# Patient Record
Sex: Female | Born: 1960 | Race: White | Hispanic: No | Marital: Married | Smoking: Former smoker
Health system: Southern US, Community
[De-identification: ages and names within clinical notes are randomized; demographics above are authoritative.]

## PROBLEM LIST (undated history)

## (undated) DIAGNOSIS — I499 Cardiac arrhythmia, unspecified: Secondary | ICD-10-CM

## (undated) DIAGNOSIS — S6990XA Unspecified injury of unspecified wrist, hand and finger(s), initial encounter: Secondary | ICD-10-CM

## (undated) DIAGNOSIS — D126 Benign neoplasm of colon, unspecified: Secondary | ICD-10-CM

## (undated) DIAGNOSIS — E785 Hyperlipidemia, unspecified: Secondary | ICD-10-CM

## (undated) DIAGNOSIS — F419 Anxiety disorder, unspecified: Secondary | ICD-10-CM

## (undated) DIAGNOSIS — C439 Malignant melanoma of skin, unspecified: Secondary | ICD-10-CM

## (undated) DIAGNOSIS — T7840XA Allergy, unspecified, initial encounter: Secondary | ICD-10-CM

## (undated) DIAGNOSIS — K219 Gastro-esophageal reflux disease without esophagitis: Secondary | ICD-10-CM

## (undated) HISTORY — PX: COLONOSCOPY: SHX174

## (undated) HISTORY — PX: OTHER SURGICAL HISTORY: SHX169

## (undated) HISTORY — DX: Allergy, unspecified, initial encounter: T78.40XA

## (undated) HISTORY — DX: Benign neoplasm of colon, unspecified: D12.6

## (undated) HISTORY — DX: Hyperlipidemia, unspecified: E78.5

## (undated) HISTORY — PX: MELANOMA EXCISION: SHX5266

## (undated) HISTORY — DX: Anxiety disorder, unspecified: F41.9

## (undated) HISTORY — PX: COLONOSCOPY W/ POLYPECTOMY: SHX1380

## (undated) HISTORY — PX: VARICOSE VEIN SURGERY: SHX832

## (undated) HISTORY — DX: Malignant melanoma of skin, unspecified: C43.9

---

## 1981-09-26 DIAGNOSIS — C439 Malignant melanoma of skin, unspecified: Secondary | ICD-10-CM

## 1981-09-26 HISTORY — DX: Malignant melanoma of skin, unspecified: C43.9

## 1998-04-25 ENCOUNTER — Ambulatory Visit (HOSPITAL_COMMUNITY): Admission: RE | Admit: 1998-04-25 | Discharge: 1998-04-25 | Payer: Self-pay | Admitting: Gynecology

## 1998-04-29 ENCOUNTER — Ambulatory Visit (HOSPITAL_COMMUNITY): Admission: RE | Admit: 1998-04-29 | Discharge: 1998-04-29 | Payer: Self-pay | Admitting: Gynecology

## 1998-06-27 ENCOUNTER — Ambulatory Visit (HOSPITAL_COMMUNITY): Admission: RE | Admit: 1998-06-27 | Discharge: 1998-06-27 | Payer: Self-pay | Admitting: *Deleted

## 1998-10-31 ENCOUNTER — Encounter: Payer: Self-pay | Admitting: Gynecology

## 1998-10-31 ENCOUNTER — Ambulatory Visit (HOSPITAL_COMMUNITY): Admission: RE | Admit: 1998-10-31 | Discharge: 1998-10-31 | Payer: Self-pay | Admitting: Gynecology

## 1999-03-05 ENCOUNTER — Other Ambulatory Visit: Admission: RE | Admit: 1999-03-05 | Discharge: 1999-03-05 | Payer: Self-pay | Admitting: Gynecology

## 1999-05-11 ENCOUNTER — Ambulatory Visit (HOSPITAL_COMMUNITY): Admission: RE | Admit: 1999-05-11 | Discharge: 1999-05-11 | Payer: Self-pay | Admitting: Gynecology

## 1999-05-11 ENCOUNTER — Encounter: Payer: Self-pay | Admitting: Gynecology

## 1999-12-02 ENCOUNTER — Encounter: Admission: RE | Admit: 1999-12-02 | Discharge: 1999-12-02 | Payer: Self-pay | Admitting: Geriatric Medicine

## 1999-12-02 ENCOUNTER — Encounter: Payer: Self-pay | Admitting: Geriatric Medicine

## 2000-03-18 ENCOUNTER — Other Ambulatory Visit: Admission: RE | Admit: 2000-03-18 | Discharge: 2000-03-18 | Payer: Self-pay | Admitting: Gynecology

## 2000-03-18 ENCOUNTER — Encounter (INDEPENDENT_AMBULATORY_CARE_PROVIDER_SITE_OTHER): Payer: Self-pay

## 2001-01-30 ENCOUNTER — Encounter: Admission: RE | Admit: 2001-01-30 | Discharge: 2001-01-30 | Payer: Self-pay | Admitting: Geriatric Medicine

## 2001-01-30 ENCOUNTER — Encounter: Payer: Self-pay | Admitting: Geriatric Medicine

## 2001-05-23 ENCOUNTER — Other Ambulatory Visit: Admission: RE | Admit: 2001-05-23 | Discharge: 2001-05-23 | Payer: Self-pay | Admitting: Gynecology

## 2001-12-08 ENCOUNTER — Encounter: Admission: RE | Admit: 2001-12-08 | Discharge: 2001-12-08 | Payer: Self-pay | Admitting: Otolaryngology

## 2001-12-08 ENCOUNTER — Encounter: Payer: Self-pay | Admitting: Otolaryngology

## 2002-02-07 ENCOUNTER — Encounter: Payer: Self-pay | Admitting: Gynecology

## 2002-02-07 ENCOUNTER — Ambulatory Visit (HOSPITAL_COMMUNITY): Admission: RE | Admit: 2002-02-07 | Discharge: 2002-02-07 | Payer: Self-pay | Admitting: Gynecology

## 2002-04-13 ENCOUNTER — Encounter: Admission: RE | Admit: 2002-04-13 | Discharge: 2002-04-13 | Payer: Self-pay | Admitting: Geriatric Medicine

## 2002-04-13 ENCOUNTER — Encounter: Payer: Self-pay | Admitting: Geriatric Medicine

## 2002-06-13 ENCOUNTER — Encounter: Payer: Self-pay | Admitting: Gynecology

## 2002-06-13 ENCOUNTER — Encounter: Admission: RE | Admit: 2002-06-13 | Discharge: 2002-06-13 | Payer: Self-pay | Admitting: Gynecology

## 2002-07-20 ENCOUNTER — Other Ambulatory Visit: Admission: RE | Admit: 2002-07-20 | Discharge: 2002-07-20 | Payer: Self-pay | Admitting: Gynecology

## 2002-09-24 ENCOUNTER — Encounter: Admission: RE | Admit: 2002-09-24 | Discharge: 2002-09-24 | Payer: Self-pay | Admitting: Geriatric Medicine

## 2002-09-24 ENCOUNTER — Encounter: Payer: Self-pay | Admitting: Geriatric Medicine

## 2003-02-11 ENCOUNTER — Encounter: Admission: RE | Admit: 2003-02-11 | Discharge: 2003-02-11 | Payer: Self-pay | Admitting: Gynecology

## 2003-02-11 ENCOUNTER — Encounter: Payer: Self-pay | Admitting: Gynecology

## 2003-02-11 ENCOUNTER — Ambulatory Visit (HOSPITAL_BASED_OUTPATIENT_CLINIC_OR_DEPARTMENT_OTHER): Admission: RE | Admit: 2003-02-11 | Discharge: 2003-02-11 | Payer: Self-pay | Admitting: Plastic Surgery

## 2003-02-11 ENCOUNTER — Encounter (INDEPENDENT_AMBULATORY_CARE_PROVIDER_SITE_OTHER): Payer: Self-pay | Admitting: Specialist

## 2003-08-15 ENCOUNTER — Other Ambulatory Visit: Admission: RE | Admit: 2003-08-15 | Discharge: 2003-08-15 | Payer: Self-pay | Admitting: Obstetrics and Gynecology

## 2004-02-13 ENCOUNTER — Encounter: Admission: RE | Admit: 2004-02-13 | Discharge: 2004-02-13 | Payer: Self-pay | Admitting: General Surgery

## 2004-08-18 ENCOUNTER — Other Ambulatory Visit: Admission: RE | Admit: 2004-08-18 | Discharge: 2004-08-18 | Payer: Self-pay | Admitting: Obstetrics and Gynecology

## 2005-01-15 ENCOUNTER — Encounter (INDEPENDENT_AMBULATORY_CARE_PROVIDER_SITE_OTHER): Payer: Self-pay | Admitting: Specialist

## 2005-01-15 ENCOUNTER — Ambulatory Visit (HOSPITAL_COMMUNITY): Admission: RE | Admit: 2005-01-15 | Discharge: 2005-01-15 | Payer: Self-pay | Admitting: Vascular Surgery

## 2005-02-15 ENCOUNTER — Encounter: Admission: RE | Admit: 2005-02-15 | Discharge: 2005-02-15 | Payer: Self-pay | Admitting: Obstetrics and Gynecology

## 2005-08-25 ENCOUNTER — Other Ambulatory Visit: Admission: RE | Admit: 2005-08-25 | Discharge: 2005-08-25 | Payer: Self-pay | Admitting: Obstetrics and Gynecology

## 2006-02-24 ENCOUNTER — Encounter: Admission: RE | Admit: 2006-02-24 | Discharge: 2006-02-24 | Payer: Self-pay | Admitting: Obstetrics and Gynecology

## 2006-08-31 ENCOUNTER — Other Ambulatory Visit: Admission: RE | Admit: 2006-08-31 | Discharge: 2006-08-31 | Payer: Self-pay | Admitting: Obstetrics and Gynecology

## 2007-02-27 ENCOUNTER — Encounter: Admission: RE | Admit: 2007-02-27 | Discharge: 2007-02-27 | Payer: Self-pay | Admitting: Obstetrics and Gynecology

## 2007-03-30 ENCOUNTER — Ambulatory Visit (HOSPITAL_COMMUNITY): Admission: RE | Admit: 2007-03-30 | Discharge: 2007-03-30 | Payer: Self-pay | Admitting: Gastroenterology

## 2007-03-30 ENCOUNTER — Encounter (INDEPENDENT_AMBULATORY_CARE_PROVIDER_SITE_OTHER): Payer: Self-pay | Admitting: Gastroenterology

## 2007-09-04 ENCOUNTER — Other Ambulatory Visit: Admission: RE | Admit: 2007-09-04 | Discharge: 2007-09-04 | Payer: Self-pay | Admitting: Obstetrics and Gynecology

## 2008-02-12 ENCOUNTER — Other Ambulatory Visit: Payer: Self-pay | Admitting: Otolaryngology

## 2008-02-12 ENCOUNTER — Encounter: Admission: RE | Admit: 2008-02-12 | Discharge: 2008-02-12 | Payer: Self-pay | Admitting: Otolaryngology

## 2008-02-28 ENCOUNTER — Encounter: Admission: RE | Admit: 2008-02-28 | Discharge: 2008-02-28 | Payer: Self-pay | Admitting: Obstetrics and Gynecology

## 2008-02-29 ENCOUNTER — Encounter: Admission: RE | Admit: 2008-02-29 | Discharge: 2008-02-29 | Payer: Self-pay | Admitting: Obstetrics and Gynecology

## 2009-03-11 ENCOUNTER — Encounter: Admission: RE | Admit: 2009-03-11 | Discharge: 2009-03-11 | Payer: Self-pay | Admitting: Obstetrics and Gynecology

## 2009-05-20 ENCOUNTER — Encounter: Payer: Self-pay | Admitting: Obstetrics and Gynecology

## 2009-05-20 ENCOUNTER — Other Ambulatory Visit: Admission: RE | Admit: 2009-05-20 | Discharge: 2009-05-20 | Payer: Self-pay | Admitting: Obstetrics and Gynecology

## 2009-05-20 ENCOUNTER — Ambulatory Visit: Payer: Self-pay | Admitting: Obstetrics and Gynecology

## 2010-03-12 ENCOUNTER — Encounter: Admission: RE | Admit: 2010-03-12 | Discharge: 2010-03-12 | Payer: Self-pay | Admitting: Obstetrics and Gynecology

## 2011-03-02 NOTE — Op Note (Signed)
Stephanie Benson, MACHO             ACCOUNT NO.:  [id number]   MEDICAL RECORD NO.:  [id number]          PATIENT TYPE:  AMB   LOCATION:  ENDO                         FACILITY:  [facility]   PHYSICIAN:  Anselmo Rod, M.D.  DATE OF BIRTH:  09-Jun-1961   DATE OF PROCEDURE:  03/30/2007  DATE OF DISCHARGE:                               OPERATIVE REPORT   PROCEDURE PERFORMED:  Colonoscopy with snare polypectomy x2 and cold  biopsies x11.   ENDOSCOPIST:  Anselmo Rod, M.D.   INSTRUMENT USED:  Pentax video colonoscope.   INDICATIONS FOR PROCEDURE:  A 50 year old white female with a personal  history of melanoma removed in the past undergoing colonoscopy for  rectal bleeding rule out colonic polyps, masses, etcetera.   PREPROCEDURE PREPARATION:  Informed consent was procured from the  patient.  The patient was fasted for 8 hours prior to the procedure and  prepped with 32 OsmoPrep pills the night prior to the procedure.  The  risks and benefits of the procedure including a 10% missed rate of  cancer and polyp were discussed with the patient as well.   PREPROCEDURE PHYSICAL:  VITAL SIGNS:  The patient with stable vital  signs.  NECK:  Supple.  CHEST:  Clear to auscultation.  HEART:  S1-S2 regular.  ABDOMEN:  Soft with normal bowel sounds.   DESCRIPTION OF THE PROCEDURE:  The patient was placed in the left  lateral decubitus position and sedated with 690 mg of Propofol given  intravenously in slow incremental doses.  Once the patient was  adequately sedated and maintained on low-flow oxygen and continuous  cardiac monitoring, the Pentax video colonoscope was advanced from the  rectum to the cecum. Small internal hemorrhoids were seen on  retroflexion.  Patchy erosions were biopsied from 10-30 cm (cold  biopsies x8).  A small sessile polyp was snared from 50 cm (hot snared  x1).  Another small flat polyp was snared from the proximal right colon  (hot snared x1).  One small sessile polyp  was biopsied from the proximal  right colon (cold biopsies x1).  Two small sessile polyps were biopsied  from the left colon.  The terminal ileum appeared healthy and without  lesions.  The appendiceal orifice and ileocecal valve were visualized  after multiple washes.  There was some residual stool in the right  colon.  Multiple washes were done.  There was no evidence of  diverticulosis.  The patient tolerated the procedure well without  immediate complications.   IMPRESSION:  1. Small internal hemorrhoids.  2. Patchy erosions from 10-30 cm biopsies done; question nonsteroidals      injury.  3. Multiple colonic polyps, two removed by a hot snare; and three by      cold biopsy forceps (see description above).  4. Normal-appearing transverse colon and terminal ileum.   RECOMMENDATIONS:  1. Await pathology results.  2. Avoid all nonsteroidals including aspirin for the next 4 weeks.  3. Repeat colonoscopy depending on pathology results.  4. Outpatient followup as need arises in the future.      Anselmo Rod, M.D.  Electronically Signed     JNM/MEDQ  D:  03/30/2007  T:  03/30/2007  Job:  [id number]   cc:   Hal T. Stoneking, M.D.  Fax: [phone]

## 2011-03-05 NOTE — Op Note (Signed)
NAMENAYELIE, GIONFRIDDO                       ACCOUNT NO.:  [id number]   MEDICAL RECORD NO.:  [id number]                   PATIENT TYPE:  AMB   LOCATION:  DSC                                  FACILITY:  [facility]   PHYSICIAN:  Etter Sjogren, M.D.                  DATE OF BIRTH:  14-Oct-1961   DATE OF PROCEDURE:  02/11/2003  DATE OF DISCHARGE:                                 OPERATIVE REPORT   PREOPERATIVE DIAGNOSES:  1. 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  2. Pigmented lesion undetermined behavior, right medial knee.  3. Pigmented lesion, undetermined behavior, right lower leg.  4. Pigmented lesion, undetermined behavior, left lower forearm.   POSTOPERATIVE DIAGNOSES:  1. Evolving melanoma, right thigh, greater than 0.5 cm.  2. Lesion of undetermined behavior right medial knee greater than 0.5 cm.  3. Lesion of undetermined behavior right lower leg, less than  0.5 cm.  4. Pigmented lesion of undetermined behavior left forearm, less than  0.5     cm.  5. Complex open wounds of the right thigh and leg greater than 2.5 cm.   PROCEDURE:  1. Excision of evolving melanoma of the right anterior thigh, greater than     0.5 cm.  2. Excision lesion right medial knee greater than 0.5 cm.  3. Excision lesion right lower leg less than  0.5 cm.  4. Excision lesion undetermined behavior, arm, less than  0.5 cm.  5. Complex wound closure of thigh greater than 2.5 cm.   SURGEON:  Etter Sjogren, M.D.   ANESTHESIA:  1% Xylocaine with epinephrine plus bicarbonate.   INDICATIONS FOR PROCEDURE:  The patient is a 50 year old woman who has a  history of melanoma and has a lesion on her right anterior thigh above her  knee that has been excised by her dermatologist. This has very significant  features of atypia, and may, in fact, represent an evolving melanoma  according to her pathologist, and a wider excision of this area was  recommended. In addition she  has other  pigmented lesions that are  suspicious, and in view of  her history warrant excision. The indications  and the risks were discussed with her and she wished to proceed,  understanding that further surgery may prove necessary depending on the  final pathology report.   DESCRIPTION OF PROCEDURE:  The patient was placed in the supine position.  She was prepped with Betadine and draped with sterile drapes. The elliptical  excisions were defined and adequate anesthesia was achieved. Elliptical  incisions were performed and specimens were removed. The wound was irrigated  thoroughly and layered closures with 4-0 Monocryl interrupted inverted deep  dermal sutures and a running 4-0 Monocryl subcutaneously. Steri-Strips and a  dry sterile dressing were applied.   The patient tolerated the procedure well. I will see her back next for  recheck. She is to limit activities probably  for today, for pain.                                               Etter Sjogren, M.D.    DB/MEDQ  D:  02/11/2003  T:  02/12/2003  Job:  [id number]

## 2011-03-05 NOTE — Op Note (Signed)
Stephanie Benson, Stephanie Benson             ACCOUNT NO.:  [id number]   MEDICAL RECORD NO.:  [id number]          PATIENT TYPE:  OIB   LOCATION:  2852                         FACILITY:  [facility]   PHYSICIAN:  Larina Earthly, M.D.    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   PROCEDURE:  Ligation and stripping of left greater saphenous vein from groin  to ankle followed by stab evulsion removal of tributary varicosities in the  left calf and onto the medial ankle.   SURGEON:  Dr. Tawanna Cooler Early   ASSISTANT:  Nurse   ANESTHESIA:  General endotracheal.   COMPLICATIONS:  None.   DISPOSITION:  To recovery room stable.   PROCEDURE IN DETAIL:  The patient was taken to the operating room and while  standing, had marking of her left calf and onto her foot of her tributary  varicosities.  She had marked saphenous vein incompetence throughout its  course with a dilated saphenous vein down to the level of the ankle.  The  patient was placed in the supine position, and general endotracheal  anesthesia was administered.  Incision was made just above the groin crease,  medial to the left femoral pulse, carried down to isolate the saphenofemoral  junction.  The patient had multiple tributary branches of this, and these  were removed with evulsion.  The saphenous vein was doubly ligated with 2-0  silk ties at the junction with the common femoral vein.  Next, a separate  incision was made at the ankle and tributary branches of the saphenous vein  were removed with evulsion, and this was  continued down onto the medial  foot.  The branches were evulsed, and hemostasis was obtained with digital  pressure.  The saphenous vein was identified through the same incision and  was ligated distally, was opened with an 11 blade, and the vein stripper was  passed up to the level of the groin.  The stripper was brought through the  saphenous vein distal to the prior ligation site, and the 2-0 silk tie was  placed around the vein for securing the stripper.  The saphenous vein was  divided.  Next, several separate stab incisions were made in the calf, and  tributary varicosities were removed with stab evulsion technique.  Again,  pressure was held for hemostasis.  The saphenous vein was then stripped from  the groin to the ankle, and pressure was held for hemostasis.  The wounds  were irrigated with saline.  The stab incisions were closed with 4-0  subcuticular Vicryl sutures.  The ankle incision was closed with a running 4-  0 Vicryl suture.  The groin incision was  closed with several layers of 3-0 Vicryl, including the  subcuticular level.  Benzoin and Steri-Strips were applied over the incisions.  The leg was  dressed with Kerlix and a Coban pressure dressing.  The patient was  transferred to the recovery room in stable condition.      TFE/MEDQ  D:  01/15/2005  T:  01/15/2005  Job:  [id number]

## 2011-04-13 ENCOUNTER — Other Ambulatory Visit: Payer: Self-pay | Admitting: Obstetrics and Gynecology

## 2011-04-13 DIAGNOSIS — Z1231 Encounter for screening mammogram for malignant neoplasm of breast: Secondary | ICD-10-CM

## 2011-04-19 ENCOUNTER — Ambulatory Visit
Admission: RE | Admit: 2011-04-19 | Discharge: 2011-04-19 | Disposition: A | Discharge status: Home / Self Care | Payer: Commercial Managed Care - PPO | Source: Ambulatory Visit | Attending: Obstetrics and Gynecology | Admitting: Obstetrics and Gynecology

## 2011-04-19 DIAGNOSIS — Z1231 Encounter for screening mammogram for malignant neoplasm of breast: Secondary | ICD-10-CM

## 2011-05-03 ENCOUNTER — Encounter: Payer: Self-pay | Admitting: *Deleted

## 2011-05-20 ENCOUNTER — Other Ambulatory Visit (HOSPITAL_COMMUNITY)
Admission: RE | Admit: 2011-05-20 | Discharge: 2011-05-20 | Disposition: A | Discharge status: Home / Self Care | Payer: 59 | Source: Ambulatory Visit | Attending: Obstetrics and Gynecology | Admitting: Obstetrics and Gynecology

## 2011-05-20 ENCOUNTER — Encounter: Payer: Self-pay | Admitting: Obstetrics and Gynecology

## 2011-05-20 ENCOUNTER — Ambulatory Visit (INDEPENDENT_AMBULATORY_CARE_PROVIDER_SITE_OTHER): Payer: 59 | Admitting: Obstetrics and Gynecology

## 2011-05-20 VITALS — BP 110/70 | Ht 67.5 in | Wt 144.0 lb

## 2011-05-20 DIAGNOSIS — Z01419 Encounter for gynecological examination (general) (routine) without abnormal findings: Secondary | ICD-10-CM

## 2011-05-20 DIAGNOSIS — Z0000 Encounter for general adult medical examination without abnormal findings: Secondary | ICD-10-CM

## 2011-05-20 NOTE — Progress Notes (Signed)
The patient came to see me today for an annual GYN exam. She is now menopausal and has had several episodes of spotting but none in the last year. She has had her yearly mammogram. She's never had a bone density. She is using Lexapro and is increase the dose recently from 10 mg to 15 mg. She sees a psychiatrist for this. She is not having many menopausal symptoms I suspected related to the use of Lexapro.  ROS: Unchanged  Physical examination: HEENT within normal limits. Neck: Thyroid not large. No masses. Supraclavicular nodes: not enlarged. Breasts: Examined in both sitting midline position. No skin changes and no masses. Abdomen: Soft no guarding rebound or masses or hernia. Pelvic: External: Within normal limits. BUS: Within normal limits. Vaginal:within normal limits. Good estrogen effect. No evidence of cystocele rectocele or enterocele. Cervix: clean. Uterus: Normal size and shape. Adnexa: No masses. Rectovaginal exam: Confirmatory and negative. Extremities: Within normal limits.  Assessment: Normal GYN exam in postmenopausal woman  Plan: Continue yearly mammograms, she will get a bone density with her mammogram next year.

## 2011-07-13 LAB — URINALYSIS, ROUTINE W REFLEX MICROSCOPIC
Bilirubin Urine: NEGATIVE
Glucose, UA: NEGATIVE
Hgb urine dipstick: NEGATIVE
Ketones, ur: NEGATIVE
Nitrite: NEGATIVE
Protein, ur: NEGATIVE
Specific Gravity, Urine: 1.016
Urobilinogen, UA: 0.2
pH: 6.5

## 2011-07-13 LAB — CBC
HCT: 42.4
Hemoglobin: 14.2
MCHC: 33.6
MCV: 94.5
Platelets: 283
RBC: 4.49
RDW: 12.5
WBC: 8.2

## 2011-07-13 LAB — APTT: aPTT: 30

## 2011-07-13 LAB — DIFFERENTIAL
Basophils Absolute: 0
Basophils Relative: 0
Eosinophils Absolute: 0.1
Eosinophils Relative: 1
Lymphocytes Relative: 32
Lymphs Abs: 2.6
Monocytes Absolute: 0.6
Monocytes Relative: 7
Neutro Abs: 4.9
Neutrophils Relative %: 60

## 2011-07-13 LAB — PROTIME-INR
INR: 0.9
Prothrombin Time: 12.2

## 2011-08-05 LAB — HEMOGLOBIN AND HEMATOCRIT, BLOOD
HCT: 43.7
Hemoglobin: 14.9

## 2011-09-27 ENCOUNTER — Encounter (HOSPITAL_COMMUNITY): Payer: Self-pay | Admitting: Pharmacy Technician

## 2011-09-27 ENCOUNTER — Encounter (HOSPITAL_COMMUNITY): Payer: Self-pay | Admitting: *Deleted

## 2011-09-27 ENCOUNTER — Other Ambulatory Visit: Payer: Self-pay | Admitting: Orthopedic Surgery

## 2011-09-27 DIAGNOSIS — C439 Malignant melanoma of skin, unspecified: Secondary | ICD-10-CM

## 2011-09-27 DIAGNOSIS — K219 Gastro-esophageal reflux disease without esophagitis: Secondary | ICD-10-CM

## 2011-09-27 DIAGNOSIS — F419 Anxiety disorder, unspecified: Secondary | ICD-10-CM

## 2011-09-27 DIAGNOSIS — S6990XA Unspecified injury of unspecified wrist, hand and finger(s), initial encounter: Secondary | ICD-10-CM

## 2011-09-27 HISTORY — DX: Malignant melanoma of skin, unspecified: C43.9

## 2011-09-27 HISTORY — DX: Anxiety disorder, unspecified: F41.9

## 2011-09-27 HISTORY — DX: Unspecified injury of unspecified wrist, hand and finger(s), initial encounter: S69.90XA

## 2011-09-27 HISTORY — DX: Gastro-esophageal reflux disease without esophagitis: K21.9

## 2011-09-28 ENCOUNTER — Ambulatory Visit (HOSPITAL_COMMUNITY)
Admission: RE | Admit: 2011-09-28 | Discharge: 2011-09-28 | Disposition: A | Discharge status: Home / Self Care | Payer: 59 | Source: Ambulatory Visit | Attending: Orthopedic Surgery | Admitting: Orthopedic Surgery

## 2011-09-28 ENCOUNTER — Ambulatory Visit (HOSPITAL_COMMUNITY): Payer: 59 | Admitting: Anesthesiology

## 2011-09-28 ENCOUNTER — Encounter (HOSPITAL_COMMUNITY): Payer: Self-pay | Admitting: *Deleted

## 2011-09-28 ENCOUNTER — Encounter (HOSPITAL_COMMUNITY)
Admission: RE | Disposition: A | Discharge status: Home / Self Care | Payer: Self-pay | Source: Ambulatory Visit | Attending: Orthopedic Surgery

## 2011-09-28 ENCOUNTER — Encounter (HOSPITAL_COMMUNITY): Payer: Self-pay | Admitting: Anesthesiology

## 2011-09-28 DIAGNOSIS — F411 Generalized anxiety disorder: Secondary | ICD-10-CM | POA: Insufficient documentation

## 2011-09-28 DIAGNOSIS — S61209A Unspecified open wound of unspecified finger without damage to nail, initial encounter: Secondary | ICD-10-CM | POA: Insufficient documentation

## 2011-09-28 DIAGNOSIS — C4370 Malignant melanoma of unspecified lower limb, including hip: Secondary | ICD-10-CM | POA: Insufficient documentation

## 2011-09-28 DIAGNOSIS — K219 Gastro-esophageal reflux disease without esophagitis: Secondary | ICD-10-CM | POA: Insufficient documentation

## 2011-09-28 DIAGNOSIS — IMO0002 Reserved for concepts with insufficient information to code with codable children: Secondary | ICD-10-CM | POA: Insufficient documentation

## 2011-09-28 DIAGNOSIS — Z79899 Other long term (current) drug therapy: Secondary | ICD-10-CM | POA: Insufficient documentation

## 2011-09-28 DIAGNOSIS — X58XXXA Exposure to other specified factors, initial encounter: Secondary | ICD-10-CM | POA: Insufficient documentation

## 2011-09-28 HISTORY — DX: Gastro-esophageal reflux disease without esophagitis: K21.9

## 2011-09-28 HISTORY — PX: I & D EXTREMITY: SHX5045

## 2011-09-28 HISTORY — DX: Unspecified injury of unspecified wrist, hand and finger(s), initial encounter: S69.90XA

## 2011-09-28 LAB — CBC
HCT: 40.3 % (ref 36.0–46.0)
Hemoglobin: 13.7 g/dL (ref 12.0–15.0)
MCH: 31.4 pg (ref 26.0–34.0)
MCHC: 34 g/dL (ref 30.0–36.0)
MCV: 92.2 fL (ref 78.0–100.0)
Platelets: 252 10*3/uL (ref 150–400)
RBC: 4.37 MIL/uL (ref 3.87–5.11)
RDW: 12 % (ref 11.5–15.5)
WBC: 5.9 10*3/uL (ref 4.0–10.5)

## 2011-09-28 LAB — SURGICAL PCR SCREEN
MRSA, PCR: NEGATIVE
Staphylococcus aureus: NEGATIVE

## 2011-09-28 LAB — COMPREHENSIVE METABOLIC PANEL
ALT: 25 U/L (ref 0–35)
AST: 29 U/L (ref 0–37)
Albumin: 3.9 g/dL (ref 3.5–5.2)
Alkaline Phosphatase: 79 U/L (ref 39–117)
BUN: 11 mg/dL (ref 6–23)
CO2: 27 mEq/L (ref 19–32)
Calcium: 9.8 mg/dL (ref 8.4–10.5)
Chloride: 104 mEq/L (ref 96–112)
Creatinine, Ser: 0.85 mg/dL (ref 0.50–1.10)
GFR calc Af Amer: >90 mL/min (ref >90)
GFR calc non Af Amer: 79 mL/min — ABNORMAL LOW (ref >90)
Glucose, Bld: 87 mg/dL (ref 70–99)
Potassium: 3.5 mEq/L (ref 3.5–5.1)
Sodium: 139 mEq/L (ref 135–145)
Total Bilirubin: 0.8 mg/dL (ref 0.3–1.2)
Total Protein: 6.8 g/dL (ref 6.0–8.3)

## 2011-09-28 SURGERY — IRRIGATION AND DEBRIDEMENT EXTREMITY
Anesthesia: General | Site: Thumb | Laterality: Right | Wound class: Clean Contaminated

## 2011-09-28 MED ORDER — SUFENTANIL CITRATE 50 MCG/ML IV SOLN
INTRAVENOUS | Status: DC | PRN
  Administered 2011-09-28: 5 ug via INTRAVENOUS

## 2011-09-28 MED ORDER — LACTATED RINGERS IV SOLN
INTRAVENOUS | Status: DC | PRN
  Administered 2011-09-28 (×2): via INTRAVENOUS

## 2011-09-28 MED ORDER — SODIUM CHLORIDE 0.45 % IV SOLN: INTRAVENOUS | Status: DC

## 2011-09-28 MED ORDER — CEFAZOLIN SODIUM 1-5 GM-% IV SOLN
INTRAVENOUS | Status: DC | PRN
  Administered 2011-09-28: 2 g via INTRAVENOUS

## 2011-09-28 MED ORDER — CEFAZOLIN SODIUM 1-5 GM-% IV SOLN
INTRAVENOUS | Status: AC
  Filled 2011-09-28: qty 50

## 2011-09-28 MED ORDER — ACETAMINOPHEN 10 MG/ML IV SOLN
INTRAVENOUS | Status: AC
  Filled 2011-09-28: qty 100

## 2011-09-28 MED ORDER — CHLORHEXIDINE GLUCONATE 4 % EX LIQD: 60.0000 mL | Freq: Once | CUTANEOUS | Status: DC

## 2011-09-28 MED ORDER — MUPIROCIN 2 % EX OINT
TOPICAL_OINTMENT | CUTANEOUS | Status: AC
  Filled 2011-09-28: qty 22

## 2011-09-28 MED ORDER — PROMETHAZINE HCL 25 MG/ML IJ SOLN: 6.2500 mg | INTRAMUSCULAR | Status: DC | PRN

## 2011-09-28 MED ORDER — MEPERIDINE HCL 25 MG/ML IJ SOLN: 6.2500 mg | INTRAMUSCULAR | Status: DC | PRN

## 2011-09-28 MED ORDER — POVIDONE-IODINE 7.5 % EX SOLN: Freq: Once | CUTANEOUS | Status: DC

## 2011-09-28 MED ORDER — LACTATED RINGERS IV SOLN: INTRAVENOUS | Status: DC

## 2011-09-28 MED ORDER — BUPIVACAINE HCL (PF) 0.25 % IJ SOLN
INTRAMUSCULAR | Status: DC | PRN
  Administered 2011-09-28: 8 mL

## 2011-09-28 MED ORDER — DEXAMETHASONE SODIUM PHOSPHATE 4 MG/ML IJ SOLN
INTRAMUSCULAR | Status: DC | PRN
  Administered 2011-09-28: 10 mg via INTRAVENOUS

## 2011-09-28 MED ORDER — BUPIVACAINE HCL (PF) 0.25 % IJ SOLN
INTRAMUSCULAR | Status: AC
  Filled 2011-09-28: qty 30

## 2011-09-28 MED ORDER — ACETAMINOPHEN 10 MG/ML IV SOLN
INTRAVENOUS | Status: DC | PRN
  Administered 2011-09-28: 1000 mg via INTRAVENOUS

## 2011-09-28 MED ORDER — CEFAZOLIN SODIUM-DEXTROSE 2-3 GM-% IV SOLR: 2.0000 g | INTRAVENOUS | Status: DC

## 2011-09-28 MED ORDER — SODIUM CHLORIDE 0.9 % IR SOLN
Status: DC | PRN
  Administered 2011-09-28: 1000 mL

## 2011-09-28 MED ORDER — PROPOFOL 10 MG/ML IV BOLUS
INTRAVENOUS | Status: DC | PRN
  Administered 2011-09-28 (×2): 50 mg via INTRAVENOUS
  Administered 2011-09-28: 150 mg via INTRAVENOUS

## 2011-09-28 MED ORDER — ONDANSETRON HCL 4 MG/2ML IJ SOLN
INTRAMUSCULAR | Status: DC | PRN
  Administered 2011-09-28: 4 mg via INTRAVENOUS

## 2011-09-28 MED ORDER — LIDOCAINE HCL (CARDIAC) 10 MG/ML IV SOLN
INTRAVENOUS | Status: DC | PRN
  Administered 2011-09-28: 80 mg via INTRAVENOUS

## 2011-09-28 MED ORDER — FENTANYL CITRATE 0.05 MG/ML IJ SOLN: 25.0000 ug | INTRAMUSCULAR | Status: DC | PRN

## 2011-09-28 MED ORDER — MIDAZOLAM HCL 5 MG/5ML IJ SOLN
INTRAMUSCULAR | Status: DC | PRN
  Administered 2011-09-28: 2 mg via INTRAVENOUS

## 2011-09-28 SURGICAL SUPPLY — 38 items
BAG ZIPLOCK 12X15 (MISCELLANEOUS) ×2 IMPLANT
BANDAGE ELASTIC 3 VELCRO ST LF (GAUZE/BANDAGES/DRESSINGS) ×2 IMPLANT
BANDAGE ELASTIC 4 VELCRO ST LF (GAUZE/BANDAGES/DRESSINGS) ×2 IMPLANT
BANDAGE GAUZE 4  KLING STR (GAUZE/BANDAGES/DRESSINGS) ×2 IMPLANT
BANDAGE GAUZE ELAST BULKY 4 IN (GAUZE/BANDAGES/DRESSINGS) ×2 IMPLANT
BLADE SURG SZ10 CARB STEEL (BLADE) ×2 IMPLANT
CLOTH BEACON ORANGE TIMEOUT ST (SAFETY) ×2 IMPLANT
CUFF TOURN SGL QUICK 18 (TOURNIQUET CUFF) ×2 IMPLANT
DRAIN PENROSE 18X1/2 LTX STRL (DRAIN) IMPLANT
DRAPE SURG 17X11 SM STRL (DRAPES) ×2 IMPLANT
DRSG ADAPTIC 3X8 NADH LF (GAUZE/BANDAGES/DRESSINGS) ×2 IMPLANT
DRSG PAD ABDOMINAL 8X10 ST (GAUZE/BANDAGES/DRESSINGS) ×2 IMPLANT
ELECT REM PT RETURN 9FT ADLT (ELECTROSURGICAL) ×2
ELECTRODE REM PT RTRN 9FT ADLT (ELECTROSURGICAL) ×1 IMPLANT
GAUZE SPONGE 4X4 12PLY STRL LF (GAUZE/BANDAGES/DRESSINGS) ×2 IMPLANT
GAUZE XEROFORM 1X8 LF (GAUZE/BANDAGES/DRESSINGS) ×2 IMPLANT
GAUZE XEROFORM 5X9 LF (GAUZE/BANDAGES/DRESSINGS) ×2 IMPLANT
GLOVE BIO SURGEON STRL SZ8 (GLOVE) ×2 IMPLANT
GOWN STRL REIN XL XLG (GOWN DISPOSABLE) ×2 IMPLANT
KIT BASIN OR (CUSTOM PROCEDURE TRAY) ×2 IMPLANT
MANIFOLD NEPTUNE II (INSTRUMENTS) ×2 IMPLANT
PACK LOWER EXTREMITY WL (CUSTOM PROCEDURE TRAY) ×2 IMPLANT
PAD CAST 4YDX4 CTTN HI CHSV (CAST SUPPLIES) ×1 IMPLANT
PADDING CAST COTTON 4X4 STRL (CAST SUPPLIES) ×1
PADDING WEBRIL 4 STERILE (GAUZE/BANDAGES/DRESSINGS) ×2 IMPLANT
POSITIONER SURGICAL ARM (MISCELLANEOUS) ×2 IMPLANT
SOL PREP POV-IOD 16OZ 10% (MISCELLANEOUS) ×2 IMPLANT
SOL PREP PROV IODINE SCRUB 4OZ (MISCELLANEOUS) ×2 IMPLANT
SPLINT FIBERGLASS 4X30 (CAST SUPPLIES) ×2 IMPLANT
SPONGE GAUZE 4X4 12PLY (GAUZE/BANDAGES/DRESSINGS) ×2 IMPLANT
SUT PROLENE 3 0 PS 2 (SUTURE) ×2 IMPLANT
SUT VIC AB 1 CT1 27 (SUTURE) ×1
SUT VIC AB 1 CT1 27XBRD ANTBC (SUTURE) ×1 IMPLANT
SUT VIC AB 2-0 CT1 27 (SUTURE) ×1
SUT VIC AB 2-0 CT1 27XBRD (SUTURE) ×1 IMPLANT
SYR 20CC LL (SYRINGE) IMPLANT
SYR CONTROL 10ML LL (SYRINGE) ×2 IMPLANT
TOWEL OR 17X26 10 PK STRL BLUE (TOWEL DISPOSABLE) ×2 IMPLANT

## 2011-09-28 NOTE — Anesthesia Preprocedure Evaluation (Signed)
Anesthesia Evaluation  Patient identified by MRN, date of birth, ID band Patient awake    Reviewed: Allergy & Precautions, H&P , NPO status , Patient's Chart, lab work & pertinent test results, reviewed documented beta blocker date and time   Airway Mallampati: II TM Distance: >3 FB Neck ROM: full    Dental No notable dental hx.    Pulmonary neg pulmonary ROS,  clear to auscultation  Pulmonary exam normal       Cardiovascular Exercise Tolerance: Good neg cardio ROS regular Normal    Neuro/Psych Negative Neurological ROS  Negative Psych ROS   GI/Hepatic negative GI ROS, Neg liver ROS,   Endo/Other  Negative Endocrine ROS  Renal/GU negative Renal ROS  Genitourinary negative   Musculoskeletal   Abdominal   Peds  Hematology negative hematology ROS (+)   Anesthesia Other Findings   Reproductive/Obstetrics negative OB ROS                           Anesthesia Physical Anesthesia Plan  ASA: II  Anesthesia Plan: General   Post-op Pain Management:    Induction:   Airway Management Planned:   Additional Equipment:   Intra-op Plan:   Post-operative Plan:   Informed Consent: I have reviewed the patients History and Physical, chart, labs and discussed the procedure including the risks, benefits and alternatives for the proposed anesthesia with the patient or authorized representative who has indicated his/her understanding and acceptance.   Dental Advisory Given  Plan Discussed with: CRNA  Anesthesia Plan Comments:         Anesthesia Quick Evaluation

## 2011-09-28 NOTE — Preoperative (Signed)
Beta Blockers   Reason not to administer Beta Blockers:Not Applicable 

## 2011-09-28 NOTE — Anesthesia Postprocedure Evaluation (Signed)
  Anesthesia Post-op Note  Patient: Stephanie Benson  Procedure(s) Performed:  IRRIGATION AND DEBRIDEMENT EXTREMITY - Irrigation and Debridement ; NERVE, TENDON AND ARTERY REPAIR - Radial digital nerve repair  Patient Location: PACU  Anesthesia Type: General  Level of Consciousness: awake and alert   Airway and Oxygen Therapy: Patient Spontanous Breathing  Post-op Pain: mild  Post-op Assessment: Post-op Vital signs reviewed, Patient's Cardiovascular Status Stable, Respiratory Function Stable, Patent Airway and No signs of Nausea or vomiting  Post-op Vital Signs: stable  Complications: No apparent anesthesia complications

## 2011-09-28 NOTE — Discharge Summary (Signed)
  Patient did well with I&D and repair  Plan follow up in 10 days all instructions given to patient

## 2011-09-28 NOTE — H&P (Signed)
Stephanie Benson is an 50 y.o. female.   Chief Complaint:laceration to right thumb HPI: Patient presents for repair right thumb after laceration 2 days ago  Past Medical History  Diagnosis Date  . Melanoma 09-27-11    left ankle  . Anxiety 09-27-11    no panic attacks-tx. Lexapro  . GERD (gastroesophageal reflux disease) 09-27-11     past,no meds in 5 yrs  . Thumb injury 09-27-11    cut Rt. Thumb on broken glass, is stitched    Past Surgical History  Procedure Date  . Varicose vein surgery     2x  . Melanoma excision   . Ear surgery 2008 left     Family History  Problem Relation Age of Onset  . Diabetes Mother   . Hypertension Mother   . Hypertension Father   . Heart disease Father   . Heart failure Father   . Diabetes Maternal Grandmother   . Diabetes Maternal Grandfather   . Diabetes Paternal Grandmother   . Heart disease Paternal Grandmother   . Cancer Paternal Grandmother     pancreatic  . Diabetes Paternal Grandfather    Social History:  reports that she has quit smoking. She has never used smokeless tobacco. She reports that she drinks alcohol. She reports that she does not use illicit drugs.  Allergies: No Known Allergies  Medications Prior to Admission  Medication Dose Route Frequency Provider Last Rate Last Dose  . 0.45 % sodium chloride infusion   Intravenous Continuous Sheran Lawless, PA      . ceFAZolin (ANCEF) IVPB 2 g/50 mL premix  2 g Intravenous 60 min Pre-Op Sheran Lawless, PA      . chlorhexidine (HIBICLENS) 4 % liquid 4 application  60 mL Topical Once Sheran Lawless, PA      . mupirocin ointment (BACTROBAN) 2 %           . povidone-iodine (BETADINE) 7.5 % scrub   Topical Once Sheran Lawless, PA       Medications Prior to Admission  Medication Sig Dispense Refill  . Ascorbic Acid (VITAMIN C) 1000 MG tablet Take 1,000 mg by mouth daily.        . cephALEXin (KEFLEX) 500 MG capsule Take 500 mg by mouth 4 (four) times daily. For 10 days.        Marland Kitchen escitalopram (LEXAPRO) 10 MG tablet Take 15 mg by mouth daily.       Marland Kitchen pyridOXINE (VITAMIN B-6) 100 MG tablet Take 100 mg by mouth daily.          Results for orders placed during the hospital encounter of 09/28/11 (from the past 48 hour(s))  CBC     Status: Normal   Collection Time   09/28/11  2:25 PM      Component Value Range Comment   WBC 5.9  4.0 - 10.5 (K/uL)    RBC 4.37  3.87 - 5.11 (MIL/uL)    Hemoglobin 13.7  12.0 - 15.0 (g/dL)    HCT 16.1  09.6 - 04.5 (%)    MCV 92.2  78.0 - 100.0 (fL)    MCH 31.4  26.0 - 34.0 (pg)    MCHC 34.0  30.0 - 36.0 (g/dL)    RDW 40.9  81.1 - 91.4 (%)    Platelets 252  150 - 400 (K/uL)   COMPREHENSIVE METABOLIC PANEL     Status: Abnormal   Collection Time   09/28/11  2:25 PM  Component Value Range Comment   Sodium 139  135 - 145 (mEq/L)    Potassium 3.5  3.5 - 5.1 (mEq/L)    Chloride 104  96 - 112 (mEq/L)    CO2 27  19 - 32 (mEq/L)    Glucose, Bld 87  70 - 99 (mg/dL)    BUN 11  6 - 23 (mg/dL)    Creatinine, Ser 7.25  0.50 - 1.10 (mg/dL)    Calcium 9.8  8.4 - 10.5 (mg/dL)    Total Protein 6.8  6.0 - 8.3 (g/dL)    Albumin 3.9  3.5 - 5.2 (g/dL)    AST 29  0 - 37 (U/L)    ALT 25  0 - 35 (U/L)    Alkaline Phosphatase 79  39 - 117 (U/L)    Total Bilirubin 0.8  0.3 - 1.2 (mg/dL)    GFR calc non Af Amer 79 (*) >90 (mL/min)    GFR calc Af Amer >90  >90 (mL/min)   SURGICAL PCR SCREEN     Status: Normal   Collection Time   09/28/11  2:31 PM      Component Value Range Comment   MRSA, PCR NEGATIVE  NEGATIVE     Staphylococcus aureus NEGATIVE  NEGATIVE     No results found.  ROS  Blood pressure 103/66, pulse 83, temperature 97.7 F (36.5 C), temperature source Oral, resp. rate 18, height 5' 7.5" (1.715 m), weight 64.411 kg (142 lb), last menstrual period 10/19/2007, SpO2 100.00%. Physical Exam laceration right thumb with nerve injury .Marland KitchenThe patient is alert and oriented in no acute distress the patient complains of pain in the affected  upper extremity. The patient is noted to have a normal HEENT exam. Lung fields show equal chest expansion and no shortness of breath abdomen exam is nontender without distention. Lower extremity examination does not show any fracture dislocation or blood clot symptoms. Pelvis is stable neck and back are stable and nontender  Assessment/Plan .Marland KitchenWe are planning surgery for your upper extremity. The risk and benefits of surgery include risk of bleeding infection anesthesia damage to normal structures and failure of the surgery to accomplish its intended goals of relieving symptoms and restoring function with this in mind we'll going to proceed. I have specifically discussed with the patient the pre-and postoperative regime and the does and don'ts and risk and benefits in great detail. Risk and benefits of surgery also include risk of dystrophy chronic nerve pain failure of the healing process to go onto completion and other inherent risks of surgery The relavent the pathophysiology of the disease/injury process, as well as the alternatives for treatment and postoperative course of action has been discussed in great detail with the patient who desires to proceed.  We will do everything in our power to help you (the patient) restore function to the upper extremity. Is a pleasure to see this patient today. Plan I&D and reconstruction of the right thumb as necessary Hikaru Delorenzo III,Bralin Garry M 09/28/2011, 5:55 PM

## 2011-09-28 NOTE — Progress Notes (Signed)
Patient ID: Stephanie Benson, female   DOB: 1961-04-02, 50 y.o.   MRN: [id number] Dictated surgery note [phone]

## 2011-09-28 NOTE — Transfer of Care (Signed)
Immediate Anesthesia Transfer of Care Note  Patient: Stephanie Benson  Procedure(s) Performed:  IRRIGATION AND DEBRIDEMENT EXTREMITY - Irrigation and Debridement ; NERVE, TENDON AND ARTERY REPAIR - Radial digital nerve repair  Patient Location: PACU  Anesthesia Type: General  Level of Consciousness: awake, alert  and oriented  Airway & Oxygen Therapy: Patient Spontanous Breathing and Patient connected to face mask oxygen  Post-op Assessment: Report given to PACU RN and Post -op Vital signs reviewed and stable  Post vital signs: Reviewed and stable  Complications: No apparent anesthesia complications

## 2011-09-29 NOTE — Op Note (Signed)
NAMEALEXIUS, Stephanie Benson NO.:  [id number]  MEDICAL RECORD NO.:  [id number]  LOCATION:  [facility]                         FACILITY:  [facility]  PHYSICIAN:  Dionne Ano. Erandi Lemma, M.D.DATE OF BIRTH:  1961-09-19  DATE OF PROCEDURE: DATE OF DISCHARGE:  09/28/2011                              OPERATIVE REPORT   PREOPERATIVE DIAGNOSIS:  Laceration, right thumb, with digital nerve involvement.  POSTOPERATIVE DIAGNOSIS:  Radial digital nerve laceration, right thumb, intact flexor tendon apparatus.  SURGICAL PROCEDURE PERFORMED: 1. Irrigation and debridement of skin, subcutaneous tissue, flexor     tendon sheath, and deep periosteal soft tissue, this was incisional     debridement. 2. Repair radial digital nerve, right thumb.  SURGEON:  Dionne Ano. Amanda Pea, M.D.  ASSISTANT:  None.  COMPLICATIONS:  None.  ANESTHESIA:  General.  TOURNIQUET TIME:  Less than an hour.  INDICATIONS FOR PROCEDURE:  A pleasant female who presents to me on this diagnosis.  She has underwent evaluation by myself and elects to proceed with exploration and reconstruction as necessary.  OPERATION IN DETAIL:  The patient was seen by myself and Anesthesia, taken to the operative suite, and underwent smooth induction of general anesthetic.  A time-out was called.  She was prepped and draped in the usual sterile fashion with Betadine scrub and paint above the right upper extremity after sterile field was secured.  Once this was complete, the patient then underwent very careful placement of sterile field.  Once sterile field was accomplished with Betadine scrub and paint, I removed prior sutures and extended limbs proximally and distally.  At this time, I performed irrigation and debridement of skin, subcutaneous tissue, tendon sheath tissue, and the periosteal soft tissue.  Bone was intact, tendon was intact, radial digital nerve had a laceration to it.  At this time, I performed very careful and copious  irrigation followed by repair of the digital nerve with microsurgical technique.  An epineural repair was accomplished without difficulty utilizing 10-0 nylon.  I was able to circumferentially repair the nerve very nicely. There was a very volar strand intact and thus it served excellently for topographical anatomy purposes in terms of restoring the nerve to its proper alignment.  I was pleased with these new findings, I took intraoperative photos and following this, deflated tourniquet irrigated, closed the wound with a combination of Prolene and nylon of the 5 variety placed 7 cc of Sensorcaine without epinephrine for a block and placed her in a thumb spica splint.  She will get a dorsal blocking splint.  I will allow her early active motion as the repair was quite sound.  We will see her back in the office in 10 days for suture removal, Steri-Strip application.  We will have her go downstairs to Therapy for a splint.  Should any problems occur, she will notify me. Otherwise, I look forward to seeing her back in the office in 10-14 days.  She will continue her Keflex and Vicodin p.r.n. pain.  She will notify me if same problems occur.  It has been an absolute pleasure to see her today.  All questions have been encouraged and answered.     Dionne Ano. Amanda Pea, M.D.  WMG/MEDQ  D:  09/28/2011  T:  09/29/2011  Job:  [id number]

## 2011-09-30 ENCOUNTER — Encounter (HOSPITAL_COMMUNITY): Payer: Self-pay | Admitting: Orthopedic Surgery

## 2012-07-17 ENCOUNTER — Other Ambulatory Visit: Payer: Self-pay | Admitting: Obstetrics and Gynecology

## 2012-07-17 DIAGNOSIS — Z1231 Encounter for screening mammogram for malignant neoplasm of breast: Secondary | ICD-10-CM

## 2012-08-07 ENCOUNTER — Ambulatory Visit
Admission: RE | Admit: 2012-08-07 | Discharge: 2012-08-07 | Disposition: A | Discharge status: Home / Self Care | Payer: 59 | Source: Ambulatory Visit | Attending: Obstetrics and Gynecology | Admitting: Obstetrics and Gynecology

## 2012-08-07 DIAGNOSIS — Z1231 Encounter for screening mammogram for malignant neoplasm of breast: Secondary | ICD-10-CM

## 2012-08-09 ENCOUNTER — Encounter: Payer: Self-pay | Admitting: Obstetrics and Gynecology

## 2012-08-09 ENCOUNTER — Ambulatory Visit (INDEPENDENT_AMBULATORY_CARE_PROVIDER_SITE_OTHER): Payer: 59 | Admitting: Obstetrics and Gynecology

## 2012-08-09 VITALS — BP 120/78 | Ht 66.5 in | Wt 142.0 lb

## 2012-08-09 DIAGNOSIS — Z23 Encounter for immunization: Secondary | ICD-10-CM

## 2012-08-09 DIAGNOSIS — Z01419 Encounter for gynecological examination (general) (routine) without abnormal findings: Secondary | ICD-10-CM

## 2012-08-09 LAB — CBC WITH DIFFERENTIAL/PLATELET
Basophils Absolute: 0 10*3/uL (ref 0.0–0.1)
Basophils Relative: 1 % (ref 0–1)
Eosinophils Absolute: 0.1 10*3/uL (ref 0.0–0.7)
Eosinophils Relative: 1 % (ref 0–5)
HCT: 43.5 % (ref 36.0–46.0)
Hemoglobin: 14.9 g/dL (ref 12.0–15.0)
Lymphocytes Relative: 34 % (ref 12–46)
Lymphs Abs: 1.9 10*3/uL (ref 0.7–4.0)
MCH: 31.4 pg (ref 26.0–34.0)
MCHC: 34.3 g/dL (ref 30.0–36.0)
MCV: 91.8 fL (ref 78.0–100.0)
Monocytes Absolute: 0.5 10*3/uL (ref 0.1–1.0)
Monocytes Relative: 8 % (ref 3–12)
Neutro Abs: 3.2 10*3/uL (ref 1.7–7.7)
Neutrophils Relative %: 56 % (ref 43–77)
Platelets: 270 10*3/uL (ref 150–400)
RBC: 4.74 MIL/uL (ref 3.87–5.11)
RDW: 13 % (ref 11.5–15.5)
WBC: 5.7 10*3/uL (ref 4.0–10.5)

## 2012-08-09 LAB — LIPID PANEL
Cholesterol: 260 mg/dL — ABNORMAL HIGH (ref 0–200)
HDL: 77 mg/dL (ref >39)
LDL Cholesterol: 172 mg/dL — ABNORMAL HIGH (ref 0–99)
Total CHOL/HDL Ratio: 3.4 Ratio
Triglycerides: 56 mg/dL (ref <150)
VLDL: 11 mg/dL (ref 0–40)

## 2012-08-09 LAB — HEMOGLOBIN A1C
Hgb A1c MFr Bld: 5.7 % — ABNORMAL HIGH (ref <5.7)
Mean Plasma Glucose: 117 mg/dL — ABNORMAL HIGH (ref <117)

## 2012-08-09 NOTE — Patient Instructions (Signed)
Schedule  colonoscopy. Schedule  bone density. Continue yearly mammograms.

## 2012-08-09 NOTE — Progress Notes (Signed)
Patient came to see me today for her annual GYN exam. She is menopausal. She does have hot flashes. She is experiencing some minor vaginal dryness. She wanted to do her lab work here today. She is not Symptomatic enough to require HRT. She is having no vaginal bleeding. She is having no pelvic pain. She had her mammogram this week. She has never had a abnormal Pap smear. Her last Pap smear was 2012. She has not had a bone density yet.  Physical examination:Stephanie Benson present. HEENT within normal limits. Neck: Thyroid not large. No masses. Supraclavicular nodes: not enlarged. Breasts: Examined in both sitting and lying  position. No skin changes and no masses. Abdomen: Soft no guarding rebound or masses or hernia. Pelvic: External: Within normal limits. BUS: Within normal limits. Vaginal:within normal limits. Good estrogen effect. No evidence of cystocele rectocele or enterocele. Cervix: clean. Uterus: Normal size and shape. Adnexa: No masses. Rectovaginal exam: Confirmatory and negative. Extremities: Within normal limits.  Assessment: Mild menopausal symptoms  Plan: Continue yearly mammograms. Discussed systemic and vaginal estrogen. She'll inform. Discussed new estrogen without progesterone. Pap not done.The new Pap smear guidelines were discussed with the patient. Schedule  bone density. Schedule followup colonoscopy-she had polyps on her last exam.

## 2012-08-10 LAB — URINALYSIS W MICROSCOPIC + REFLEX CULTURE
Bacteria, UA: NONE SEEN
Bilirubin Urine: NEGATIVE
Casts: NONE SEEN
Crystals: NONE SEEN
Glucose, UA: NEGATIVE mg/dL
Hgb urine dipstick: NEGATIVE
Ketones, ur: NEGATIVE mg/dL
Leukocytes, UA: NEGATIVE
Nitrite: NEGATIVE
Protein, ur: NEGATIVE mg/dL
Specific Gravity, Urine: 1.009 (ref 1.005–1.030)
Squamous Epithelial / LPF: NONE SEEN
Urobilinogen, UA: 0.2 mg/dL (ref 0.0–1.0)
pH: 6.5 (ref 5.0–8.0)

## 2012-08-10 NOTE — Addendum Note (Signed)
Addended by: Dayna Barker on: 08/10/2012 11:09 AM   Modules accepted: Orders

## 2012-12-04 ENCOUNTER — Encounter: Payer: Self-pay | Admitting: Gastroenterology

## 2013-01-22 ENCOUNTER — Ambulatory Visit (AMBULATORY_SURGERY_CENTER): Payer: 59 | Admitting: *Deleted

## 2013-01-22 VITALS — Ht 67.5 in | Wt 148.0 lb

## 2013-01-22 DIAGNOSIS — Z1211 Encounter for screening for malignant neoplasm of colon: Secondary | ICD-10-CM

## 2013-01-22 MED ORDER — MOVIPREP 100 G PO SOLR: ORAL | Status: DC

## 2013-02-07 ENCOUNTER — Ambulatory Visit (AMBULATORY_SURGERY_CENTER): Payer: 59 | Admitting: Gastroenterology

## 2013-02-07 ENCOUNTER — Encounter: Payer: Self-pay | Admitting: Gastroenterology

## 2013-02-07 VITALS — BP 109/66 | HR 64 | Temp 97.6°F | Resp 36 | Ht 67.5 in | Wt 148.0 lb

## 2013-02-07 DIAGNOSIS — Z1211 Encounter for screening for malignant neoplasm of colon: Secondary | ICD-10-CM

## 2013-02-07 DIAGNOSIS — Z860100 Personal history of colon polyps, unspecified: Secondary | ICD-10-CM

## 2013-02-07 MED ORDER — SODIUM CHLORIDE 0.9 % IV SOLN: 500.0000 mL | INTRAVENOUS | Status: DC

## 2013-02-07 NOTE — Progress Notes (Signed)
Patient did not experience any of the following events: a burn prior to discharge; a fall within the facility; wrong site/side/patient/procedure/implant event; or a hospital transfer or hospital admission upon discharge from the facility. (G8907) Patient did not have preoperative order for IV antibiotic SSI prophylaxis. (G8918)  

## 2013-02-07 NOTE — Op Note (Signed)
[facility] [address] [place], [address]   COLONOSCOPY PROCEDURE REPORT  PATIENT: Stephanie, Benson  MR#: [id number] BIRTHDATE: [birth year] , 52  yrs. old GENDER: Female ENDOSCOPIST: Rachael Fee, MD REFERRED Denice Paradise, M.D. PROCEDURE DATE:  02/07/2013 PROCEDURE:   Colonoscopy, surveillance ASA CLASS:   Class II INDICATIONS:two subcentimeter adenomas removed 03/2007 Dr.  Loreta [address]. MEDICATIONS: Fentanyl 75 mcg IV, Versed 7 mg IV, and These medications were titrated to patient response per physician's verbal order  DESCRIPTION OF PROCEDURE:   After the risks benefits and alternatives of the procedure were thoroughly explained, informed consent was obtained.  A digital rectal exam revealed no abnormalities of the rectum.   The LB [id number]  endoscope was introduced through the anus and advanced to the cecum, which was identified by both the appendix and ileocecal valve. No adverse events experienced.   The quality of the prep was good.  The instrument was then slowly withdrawn as the colon was fully examined.    COLON FINDINGS: The colon was somewhat redundant, tortuous but was otherwise normal.  NO polyps or cancers.  Retroflexed views revealed no abnormalities. The time to cecum=9 minutes 03 seconds. Withdrawal time=6 minutes 00 seconds.  The scope was withdrawn and the procedure completed. COMPLICATIONS: There were no complications.  ENDOSCOPIC IMPRESSION: The colon was somewhat redundant, tortuous but was otherwise normal.  No polyps or cancers.  RECOMMENDATIONS: Given your personal history of adenomatous (pre-cancerous) polyps, you will need a repeat colonoscopy in 5 years.   eSigned:  Rachael Fee, MD 02/07/2013 8:59 AM

## 2013-02-07 NOTE — Progress Notes (Signed)
Sinus rhythm to sinus bradycardia with rare PVC.

## 2013-02-07 NOTE — Patient Instructions (Signed)
Normal colon exam today. Resume current medications. Repeat colonoscopy in 5 years. Call us with any questions or concerns.  Thank you!!  YOU HAD AN ENDOSCOPIC PROCEDURE TODAY AT THE [facility]: Refer to the procedure report that was given to you for any specific questions about what was found during the examination.  If the procedure report does not answer your questions, please call your gastroenterologist to clarify.  If you requested that your care partner not be given the details of your procedure findings, then the procedure report has been included in a sealed envelope for you to review at your convenience later.  YOU SHOULD EXPECT: Some feelings of bloating in the abdomen. Passage of more gas than usual.  Walking can help get rid of the air that was put into your GI tract during the procedure and reduce the bloating. If you had a lower endoscopy (such as a colonoscopy or flexible sigmoidoscopy) you may notice spotting of blood in your stool or on the toilet paper. If you underwent a bowel prep for your procedure, then you may not have a normal bowel movement for a few days.  DIET: Your first meal following the procedure should be a light meal and then it is ok to progress to your normal diet.  A half-sandwich or bowl of soup is an example of a good first meal.  Heavy or fried foods are harder to digest and may make you feel nauseous or bloated.  Likewise meals heavy in dairy and vegetables can cause extra gas to form and this can also increase the bloating.  Drink plenty of fluids but you should avoid alcoholic beverages for 24 hours.  ACTIVITY: Your care partner should take you home directly after the procedure.  You should plan to take it easy, moving slowly for the rest of the day.  You can resume normal activity the day after the procedure however you should NOT DRIVE or use heavy machinery for 24 hours (because of the sedation medicines used during the test).    SYMPTOMS TO  REPORT IMMEDIATELY: A gastroenterologist can be reached at any hour.  During normal business hours, 8:30 AM to 5:00 PM Monday through Friday, call [phone].  After hours and on weekends, please call the GI answering service at [phone] who will take a message and have the physician on call contact you.   Following lower endoscopy (colonoscopy or flexible sigmoidoscopy):  Excessive amounts of blood in the stool  Significant tenderness or worsening of abdominal pains  Swelling of the abdomen that is new, acute  Fever of 100F or higher  Following upper endoscopy (EGD)  Vomiting of blood or coffee ground material  New chest pain or pain under the shoulder blades  Painful or persistently difficult swallowing  New shortness of breath  Fever of 100F or higher  Black, tarry-looking stools  FOLLOW UP: If any biopsies were taken you will be contacted by phone or by letter within the next 1-3 weeks.  Call your gastroenterologist if you have not heard about the biopsies in 3 weeks.  Our staff will call the home number listed on your records the next business day following your procedure to check on you and address any questions or concerns that you may have at that time regarding the information given to you following your procedure. This is a courtesy call and so if there is no answer at the home number and we have not heard from you through the emergency  physician on call, we will assume that you have returned to your regular daily activities without incident.  SIGNATURES/CONFIDENTIALITY: You and/or your care partner have signed paperwork which will be entered into your electronic medical record.  These signatures attest to the fact that that the information above on your After Visit Summary has been reviewed and is understood.  Full responsibility of the confidentiality of this discharge information lies with you and/or your care-partner.

## 2013-02-08 ENCOUNTER — Telehealth: Payer: Self-pay | Admitting: *Deleted

## 2013-02-08 NOTE — Telephone Encounter (Signed)
No answer left message to call if questions or concerns. 

## 2013-08-13 ENCOUNTER — Other Ambulatory Visit: Payer: Self-pay

## 2013-08-13 DIAGNOSIS — Z1231 Encounter for screening mammogram for malignant neoplasm of breast: Secondary | ICD-10-CM

## 2013-09-10 ENCOUNTER — Ambulatory Visit: Payer: 59

## 2013-10-15 ENCOUNTER — Ambulatory Visit
Admission: RE | Admit: 2013-10-15 | Discharge: 2013-10-15 | Disposition: A | Discharge status: Home / Self Care | Payer: 59 | Source: Ambulatory Visit

## 2013-10-15 DIAGNOSIS — Z1231 Encounter for screening mammogram for malignant neoplasm of breast: Secondary | ICD-10-CM

## 2014-01-30 ENCOUNTER — Ambulatory Visit: Payer: 59 | Admitting: *Deleted

## 2014-02-06 ENCOUNTER — Ambulatory Visit (INDEPENDENT_AMBULATORY_CARE_PROVIDER_SITE_OTHER): Payer: 59 | Admitting: Sports Medicine

## 2014-02-06 ENCOUNTER — Encounter: Payer: Self-pay | Admitting: Sports Medicine

## 2014-02-06 VITALS — BP 112/74 | HR 61 | Ht 67.5 in | Wt 148.0 lb

## 2014-02-06 DIAGNOSIS — M25369 Other instability, unspecified knee: Secondary | ICD-10-CM

## 2014-02-06 DIAGNOSIS — M238X9 Other internal derangements of unspecified knee: Secondary | ICD-10-CM

## 2014-02-06 DIAGNOSIS — M25519 Pain in unspecified shoulder: Secondary | ICD-10-CM

## 2014-02-06 NOTE — Progress Notes (Signed)
CC: Right shoulder pain, right knee instability HPI: Patient presents for evaluation of right shoulder pain. She thinks it started in group exercise class many years ago. Since then she has noted pain with overhead movement as well as with reaching behind her back. She previously was doing Pilates but had to stop doing this summary former because reaching her arms behind her back cause pain. She has a burning at the anterior posterior shoulder after workup. Pushups also bother her. She tries Motrin occasionally. She only has shoulder pain if she tries to do too much with her exercise. She will occasionally have night pain after her workouts. She has not had any rehabilitation. She denies any radicular pain or numbness. No history of shoulder surgery. With regards to her right knee she notes that it feels weak and she is concerned that is going to give out on her. It is not as steady as the left knee. Occasionally she will get pain on the medial and lateral aspects of the knee.  ROS: As above in the HPI. All other systems are stable or negative.  PMH: Mood disorder for which she takes Lexapro   Social: Patient does not smoke. She has one alcoholic drink about twice a week. She is not currently employed. Family: Positive for diabetes in her father and grandparent. Negative for heart disease. Positive for high blood pressure in her mother.  Allergies: No known drug allergies    OBJECTIVE: APPEARANCE:  Patient in no acute distress.The patient appeared well nourished and normally developed. HEENT: No scleral icterus. Conjunctiva non-injected Resp: Non labored Skin: No rash MSK:  Right Shoulder - No swelling or deformity - No TTP over AC joint, biceps tendon, or lateral humerus - FROM in flexion, abduction, internal, external rotation - Strength 5/5 on shoulder abduction, internal rotation, external rotation, empty can - Negative empty can, Hawkin's, Neer's for impingement - Labrum: Equivocal  O'Briens. Discomfort with Crank test. Equivocal Jobes relocation test - Instability: Mildly uncomfortable with apprehension test, positive sulcus sign. Beighten score 5/9 - Neurovascularly intact Right Knee - Inspection normal with no erythema or effusion or obvious bony abnormalities.  - Palpation normal with no warmth or joint line tenderness. No TTP at patellar or quad tendon.  - ROM normal in flexion and extension. - Strength 5/5 in flexion and extension. - Ligaments with solid consistent endpoints including ACL, PCL, LCL, MCL.  - Negative Mcmurray's.  - Non painful patellar compression.  - Neurovascularly intact   MSK [place]: Not performed   ASSESSMENT: #1. 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 Subjective knee instability with normal exam   PLAN: #1. For her shoulder symptoms. I suspect that this is secondary to some hypermobility and instability. she was given a rehabilitation program with emphasis on rotator cuff and scapular stabilization. She was encouraged to maintain her exercise in a safe range of motion and to not do too much overhead or behind her. Followup in one month. #2. For knee symptoms, we gave her a knee sleeve today to wear for comfort. She was encouraged to continue spin class but to minimize the amount of high resistance standing. She was also given exercises including straight leg raise, knee extension.

## 2014-03-20 ENCOUNTER — Ambulatory Visit: Payer: 59 | Admitting: Sports Medicine

## 2014-07-12 ENCOUNTER — Ambulatory Visit: Payer: Self-pay | Admitting: Women's Health

## 2014-08-19 ENCOUNTER — Encounter: Payer: Self-pay | Admitting: Sports Medicine

## 2014-12-18 ENCOUNTER — Other Ambulatory Visit: Payer: Self-pay

## 2014-12-18 DIAGNOSIS — Z1231 Encounter for screening mammogram for malignant neoplasm of breast: Secondary | ICD-10-CM

## 2014-12-20 ENCOUNTER — Encounter: Payer: Self-pay | Admitting: *Deleted

## 2014-12-23 ENCOUNTER — Ambulatory Visit: Payer: 59

## 2014-12-23 ENCOUNTER — Ambulatory Visit: Payer: 59 | Admitting: *Deleted

## 2015-01-22 ENCOUNTER — Ambulatory Visit: Payer: 59 | Admitting: *Deleted

## 2015-02-11 ENCOUNTER — Ambulatory Visit
Admission: RE | Admit: 2015-02-11 | Discharge: 2015-02-11 | Disposition: A | Discharge status: Home / Self Care | Payer: 59 | Source: Ambulatory Visit | Attending: Geriatric Medicine | Admitting: Geriatric Medicine

## 2015-02-11 ENCOUNTER — Other Ambulatory Visit: Payer: Self-pay | Admitting: Geriatric Medicine

## 2015-02-11 DIAGNOSIS — M79661 Pain in right lower leg: Secondary | ICD-10-CM

## 2015-03-04 ENCOUNTER — Ambulatory Visit: Payer: 59

## 2015-03-04 ENCOUNTER — Ambulatory Visit
Admission: RE | Admit: 2015-03-04 | Discharge: 2015-03-04 | Disposition: A | Discharge status: Home / Self Care | Payer: 59 | Source: Ambulatory Visit

## 2015-03-04 DIAGNOSIS — Z1231 Encounter for screening mammogram for malignant neoplasm of breast: Secondary | ICD-10-CM

## 2015-11-19 DIAGNOSIS — H52203 Unspecified astigmatism, bilateral: Secondary | ICD-10-CM | POA: Diagnosis not present

## 2016-01-28 ENCOUNTER — Other Ambulatory Visit: Payer: Self-pay

## 2016-01-28 DIAGNOSIS — Z1231 Encounter for screening mammogram for malignant neoplasm of breast: Secondary | ICD-10-CM

## 2016-02-19 MED FILL — ESCITALOPRAM 10 MG TABLET: 10 | 90 days supply | Qty: 90 | Fill #0

## 2016-03-10 ENCOUNTER — Ambulatory Visit: Payer: 59

## 2016-03-23 ENCOUNTER — Ambulatory Visit
Admission: RE | Admit: 2016-03-23 | Discharge: 2016-03-23 | Disposition: A | Discharge status: Home / Self Care | Payer: 59 | Source: Ambulatory Visit

## 2016-03-23 DIAGNOSIS — Z1231 Encounter for screening mammogram for malignant neoplasm of breast: Secondary | ICD-10-CM | POA: Diagnosis not present

## 2016-03-23 MED FILL — clonazePAM 0.5 MG TABS: 0.5 | 20 days supply | Qty: 20 | Fill #0

## 2016-04-13 DIAGNOSIS — R000 Tachycardia, unspecified: Secondary | ICD-10-CM | POA: Diagnosis not present

## 2016-04-13 DIAGNOSIS — R3915 Urgency of urination: Secondary | ICD-10-CM | POA: Diagnosis not present

## 2016-04-13 DIAGNOSIS — N390 Urinary tract infection, site not specified: Secondary | ICD-10-CM | POA: Diagnosis not present

## 2016-04-13 DIAGNOSIS — Z79899 Other long term (current) drug therapy: Secondary | ICD-10-CM | POA: Diagnosis not present

## 2016-04-13 DIAGNOSIS — Z0000 Encounter for general adult medical examination without abnormal findings: Secondary | ICD-10-CM | POA: Diagnosis not present

## 2016-04-13 MED FILL — CIPROFLOXACIN HCL 500 MG TA: 500 | 5 days supply | Qty: 10 | Fill #0

## 2016-04-28 DIAGNOSIS — N390 Urinary tract infection, site not specified: Secondary | ICD-10-CM | POA: Diagnosis not present

## 2016-08-05 MED FILL — AZITHROMYCIN 500 MG TABLET: 500 | 3 days supply | Qty: 3 | Fill #0

## 2016-08-05 MED FILL — ESCITALOPRAM 10 MG TABLET: 10 | 90 days supply | Qty: 90 | Fill #1

## 2016-11-25 DIAGNOSIS — H52203 Unspecified astigmatism, bilateral: Secondary | ICD-10-CM | POA: Diagnosis not present

## 2016-12-22 DIAGNOSIS — Z6824 Body mass index (BMI) 24.0-24.9, adult: Secondary | ICD-10-CM | POA: Diagnosis not present

## 2016-12-22 DIAGNOSIS — Z1151 Encounter for screening for human papillomavirus (HPV): Secondary | ICD-10-CM | POA: Diagnosis not present

## 2016-12-22 DIAGNOSIS — Z01419 Encounter for gynecological examination (general) (routine) without abnormal findings: Secondary | ICD-10-CM | POA: Diagnosis not present

## 2017-01-19 MED FILL — ESCITALOPRAM 10 MG TABLET: 10 | 90 days supply | Qty: 90 | Fill #0

## 2017-02-22 MED FILL — clonazePAM 0.5 MG TABS: 0.5 | 20 days supply | Qty: 20 | Fill #0

## 2017-04-25 MED FILL — NITROFURANTOIN MONO-MCR 100: 100 | 5 days supply | Qty: 10 | Fill #0

## 2017-06-24 MED FILL — ESCITALOPRAM 10 MG TABLET: 10 | 90 days supply | Qty: 90 | Fill #1

## 2017-07-20 ENCOUNTER — Other Ambulatory Visit: Payer: Self-pay | Admitting: Obstetrics

## 2017-07-20 DIAGNOSIS — Z1231 Encounter for screening mammogram for malignant neoplasm of breast: Secondary | ICD-10-CM

## 2017-07-29 ENCOUNTER — Ambulatory Visit: Payer: 59

## 2017-08-11 ENCOUNTER — Ambulatory Visit: Payer: 59

## 2017-08-26 ENCOUNTER — Ambulatory Visit: Payer: 59

## 2017-11-24 ENCOUNTER — Ambulatory Visit
Admission: RE | Admit: 2017-11-24 | Discharge: 2017-11-24 | Disposition: A | Discharge status: Home / Self Care | Payer: 59 | Source: Ambulatory Visit | Attending: Obstetrics | Admitting: Obstetrics

## 2017-11-24 DIAGNOSIS — Z1231 Encounter for screening mammogram for malignant neoplasm of breast: Secondary | ICD-10-CM | POA: Diagnosis not present

## 2017-12-01 MED FILL — ESCITALOPRAM 10 MG TABLET: 10 | 90 days supply | Qty: 90 | Fill #0

## 2017-12-29 DIAGNOSIS — Z419 Encounter for procedure for purposes other than remedying health state, unspecified: Secondary | ICD-10-CM | POA: Diagnosis not present

## 2017-12-29 DIAGNOSIS — L820 Inflamed seborrheic keratosis: Secondary | ICD-10-CM | POA: Diagnosis not present

## 2017-12-29 DIAGNOSIS — Z85820 Personal history of malignant melanoma of skin: Secondary | ICD-10-CM | POA: Diagnosis not present

## 2017-12-29 DIAGNOSIS — Z85828 Personal history of other malignant neoplasm of skin: Secondary | ICD-10-CM | POA: Diagnosis not present

## 2018-02-07 MED FILL — DOXYCYCLINE HYCLATE 100 MG: 100 | 7 days supply | Qty: 14 | Fill #0

## 2018-02-27 DIAGNOSIS — H524 Presbyopia: Secondary | ICD-10-CM | POA: Diagnosis not present

## 2018-03-09 ENCOUNTER — Encounter: Payer: Self-pay | Admitting: Gastroenterology

## 2018-03-09 DIAGNOSIS — D485 Neoplasm of uncertain behavior of skin: Secondary | ICD-10-CM | POA: Diagnosis not present

## 2018-03-09 DIAGNOSIS — Z189 Retained foreign body fragments, unspecified material: Secondary | ICD-10-CM | POA: Diagnosis not present

## 2018-03-09 DIAGNOSIS — L0889 Other specified local infections of the skin and subcutaneous tissue: Secondary | ICD-10-CM | POA: Diagnosis not present

## 2018-03-09 DIAGNOSIS — Z85820 Personal history of malignant melanoma of skin: Secondary | ICD-10-CM | POA: Diagnosis not present

## 2018-03-09 DIAGNOSIS — Z85828 Personal history of other malignant neoplasm of skin: Secondary | ICD-10-CM | POA: Diagnosis not present

## 2018-03-09 DIAGNOSIS — L923 Foreign body granuloma of the skin and subcutaneous tissue: Secondary | ICD-10-CM | POA: Diagnosis not present

## 2018-04-18 DIAGNOSIS — Z0000 Encounter for general adult medical examination without abnormal findings: Secondary | ICD-10-CM | POA: Diagnosis not present

## 2018-04-18 DIAGNOSIS — Z79899 Other long term (current) drug therapy: Secondary | ICD-10-CM | POA: Diagnosis not present

## 2018-04-18 DIAGNOSIS — E7800 Pure hypercholesterolemia, unspecified: Secondary | ICD-10-CM | POA: Diagnosis not present

## 2018-04-18 DIAGNOSIS — R000 Tachycardia, unspecified: Secondary | ICD-10-CM | POA: Diagnosis not present

## 2018-04-18 DIAGNOSIS — F411 Generalized anxiety disorder: Secondary | ICD-10-CM | POA: Diagnosis not present

## 2018-04-28 MED FILL — clonazePAM 0.5 MG TABS: 0.5 | 30 days supply | Qty: 30 | Fill #0

## 2018-05-17 ENCOUNTER — Encounter: Payer: Self-pay | Admitting: Gastroenterology

## 2018-05-17 DIAGNOSIS — N390 Urinary tract infection, site not specified: Secondary | ICD-10-CM | POA: Diagnosis not present

## 2018-07-01 MED FILL — ESCITALOPRAM 10 MG TABLET: 10 | 90 days supply | Qty: 90 | Fill #1

## 2018-07-04 MED FILL — SULFAMETHOXAZOLE-TMP DS TAB: 800-160 | 3 days supply | Qty: 6 | Fill #0

## 2018-07-18 ENCOUNTER — Encounter: Payer: 59 | Admitting: Gastroenterology

## 2018-09-12 MED FILL — SULFAMETHOXAZOLE-TMP DS TAB: 800-160 | 5 days supply | Qty: 10 | Fill #0

## 2018-10-31 ENCOUNTER — Encounter: Payer: Self-pay | Admitting: Gastroenterology

## 2018-11-08 DIAGNOSIS — N952 Postmenopausal atrophic vaginitis: Secondary | ICD-10-CM | POA: Diagnosis not present

## 2018-11-08 DIAGNOSIS — N393 Stress incontinence (female) (male): Secondary | ICD-10-CM | POA: Diagnosis not present

## 2018-11-08 DIAGNOSIS — N930 Postcoital and contact bleeding: Secondary | ICD-10-CM | POA: Diagnosis not present

## 2018-11-08 DIAGNOSIS — Z01419 Encounter for gynecological examination (general) (routine) without abnormal findings: Secondary | ICD-10-CM | POA: Diagnosis not present

## 2018-11-08 DIAGNOSIS — Z6823 Body mass index (BMI) 23.0-23.9, adult: Secondary | ICD-10-CM | POA: Diagnosis not present

## 2018-11-13 DIAGNOSIS — F4323 Adjustment disorder with mixed anxiety and depressed mood: Secondary | ICD-10-CM | POA: Diagnosis not present

## 2018-11-24 DIAGNOSIS — F4323 Adjustment disorder with mixed anxiety and depressed mood: Secondary | ICD-10-CM | POA: Diagnosis not present

## 2018-11-27 ENCOUNTER — Other Ambulatory Visit: Payer: Self-pay | Admitting: Obstetrics

## 2018-11-27 ENCOUNTER — Encounter: Payer: Self-pay | Admitting: Gastroenterology

## 2018-11-27 ENCOUNTER — Ambulatory Visit (AMBULATORY_SURGERY_CENTER): Payer: Self-pay

## 2018-11-27 VITALS — Ht 67.0 in | Wt 152.4 lb

## 2018-11-27 DIAGNOSIS — Z1231 Encounter for screening mammogram for malignant neoplasm of breast: Secondary | ICD-10-CM

## 2018-11-27 DIAGNOSIS — Z86010 Personal history of colonic polyps: Secondary | ICD-10-CM

## 2018-11-27 MED ORDER — NA SULFATE-K SULFATE-MG SULF 17.5-3.13-1.6 GM/177ML PO SOLN: 1.0000 | Freq: Once | ORAL | 0 refills | Status: AC

## 2018-11-27 NOTE — Progress Notes (Signed)
Denies allergies to eggs or soy products. Denies complication of anesthesia or sedation. Denies use of weight loss medication. Denies use of O2.   Emmi instructions declined.  

## 2018-11-30 DIAGNOSIS — Z85820 Personal history of malignant melanoma of skin: Secondary | ICD-10-CM | POA: Diagnosis not present

## 2018-11-30 DIAGNOSIS — D1801 Hemangioma of skin and subcutaneous tissue: Secondary | ICD-10-CM | POA: Diagnosis not present

## 2018-11-30 DIAGNOSIS — L821 Other seborrheic keratosis: Secondary | ICD-10-CM | POA: Diagnosis not present

## 2018-11-30 DIAGNOSIS — L814 Other melanin hyperpigmentation: Secondary | ICD-10-CM | POA: Diagnosis not present

## 2018-11-30 DIAGNOSIS — D225 Melanocytic nevi of trunk: Secondary | ICD-10-CM | POA: Diagnosis not present

## 2018-11-30 DIAGNOSIS — Z85828 Personal history of other malignant neoplasm of skin: Secondary | ICD-10-CM | POA: Diagnosis not present

## 2018-11-30 DIAGNOSIS — L812 Freckles: Secondary | ICD-10-CM | POA: Diagnosis not present

## 2018-12-04 DIAGNOSIS — H2513 Age-related nuclear cataract, bilateral: Secondary | ICD-10-CM | POA: Diagnosis not present

## 2018-12-07 MED FILL — SUPREP BOWEL PREP KIT: 17.5-3.13-1 | 2 days supply | Qty: 354 | Fill #0

## 2018-12-12 ENCOUNTER — Encounter: Payer: Self-pay | Admitting: Certified Registered Nurse Anesthetist

## 2018-12-12 ENCOUNTER — Ambulatory Visit (AMBULATORY_SURGERY_CENTER): Payer: 59 | Admitting: Gastroenterology

## 2018-12-12 VITALS — BP 103/70 | HR 60 | Temp 97.1°F | Resp 11 | Ht 67.0 in | Wt 148.0 lb

## 2018-12-12 DIAGNOSIS — Z86010 Personal history of colonic polyps: Secondary | ICD-10-CM | POA: Diagnosis not present

## 2018-12-12 MED ORDER — SODIUM CHLORIDE 0.9 % IV SOLN: 500.0000 mL | INTRAVENOUS | Status: DC

## 2018-12-12 NOTE — Progress Notes (Signed)
PT taken to PACU. Monitors in place. VSS. Report given to RN. 

## 2018-12-12 NOTE — Progress Notes (Signed)
Pt's states no medical or surgical changes since previsit or office visit. 

## 2018-12-12 NOTE — Op Note (Signed)
[facility] Patient Name: Stephanie Benson Procedure Date: 12/12/2018 8:51 AM MRN: [id number] Endoscopist: Milus Banister , MD Age: 58 Referring MD:  Date of Birth: December 15, 1960 Gender: Female Account #: [id number] Procedure:                Colonoscopy Indications:              High risk colon cancer surveillance: Personal                            history of colonic polyps; colonoscopy Dr. Collene Mares                            2008 two subCM adenomas. Colonoscopy Dr. Ardis Hughs                            2014 no polyps Medicines:                Monitored Anesthesia Care Procedure:                Pre-Anesthesia Assessment:                           - Prior to the procedure, a History and Physical                            was performed, and patient medications and                            allergies were reviewed. The patient's tolerance of                            previous anesthesia was also reviewed. The risks                            and benefits of the procedure and the sedation                            options and risks were discussed with the patient.                            All questions were answered, and informed consent                            was obtained. Prior Anticoagulants: The patient has                            taken no previous anticoagulant or antiplatelet                            agents. ASA Grade Assessment: II - A patient with                            mild systemic disease. After reviewing the risks  and benefits, the patient was deemed in                            satisfactory condition to undergo the procedure.                           After obtaining informed consent, the colonoscope                            was passed under direct vision. Throughout the                            procedure, the patient's blood pressure, pulse, and                            oxygen saturations were monitored continuously. The                             Colonoscope was introduced through the anus and                            advanced to the the cecum, identified by                            appendiceal orifice and ileocecal valve. The                            colonoscopy was performed without difficulty. The                            patient tolerated the procedure well. The quality                            of the bowel preparation was good. The ileocecal                            valve, appendiceal orifice, and rectum were                            photographed. Scope In: 8:53:50 AM Scope Out: 9:12:25 AM Scope Withdrawal Time: 0 hours 9 minutes 5 seconds  Total Procedure Duration: 0 hours 18 minutes 35 seconds  Findings:                 The entire examined colon appeared normal on direct                            and retroflexion views. Complications:            No immediate complications. Estimated blood loss:                            None. Estimated Blood Loss:     Estimated blood loss: none. Impression:               - The entire examined colon is normal  on direct and                            retroflexion views.                           - No polyps or cancers. Recommendation:           - Patient has a contact number available for                            emergencies. The signs and symptoms of potential                            delayed complications were discussed with the                            patient. Return to normal activities tomorrow.                            Written discharge instructions were provided to the                            patient.                           - Resume previous diet.                           - Continue present medications.                           - Repeat colonoscopy in 10 years for screening. Milus Banister, MD 12/12/2018 9:15:19 AM This report has been signed electronically.

## 2018-12-12 NOTE — Patient Instructions (Signed)
You don't need a colonoscopy for 10 years.  YOU HAD AN ENDOSCOPIC PROCEDURE TODAY AT [facility]:   Refer to the procedure report that was given to you for any specific questions about what was found during the examination.  If the procedure report does not answer your questions, please call your gastroenterologist to clarify.  If you requested that your care partner not be given the details of your procedure findings, then the procedure report has been included in a sealed envelope for you to review at your convenience later.  YOU SHOULD EXPECT: Some feelings of bloating in the abdomen. Passage of more gas than usual.  Walking can help get rid of the air that was put into your GI tract during the procedure and reduce the bloating. If you had a lower endoscopy (such as a colonoscopy or flexible sigmoidoscopy) you may notice spotting of blood in your stool or on the toilet paper. If you underwent a bowel prep for your procedure, you may not have a normal bowel movement for a few days.  Please Note:  You might notice some irritation and congestion in your nose or some drainage.  This is from the oxygen used during your procedure.  There is no need for concern and it should clear up in a day or so.  SYMPTOMS TO REPORT IMMEDIATELY:   Following lower endoscopy (colonoscopy or flexible sigmoidoscopy):  Excessive amounts of blood in the stool  Significant tenderness or worsening of abdominal pains  Swelling of the abdomen that is new, acute  Fever of 100F or higher  For urgent or emergent issues, a gastroenterologist can be reached at any hour by calling [phone].   DIET:  We do recommend a small meal at first, but then you may proceed to your regular diet.  Drink plenty of fluids but you should avoid alcoholic beverages for 24 hours.  ACTIVITY:  You should plan to take it easy for the rest of today and you should NOT DRIVE or use heavy machinery until tomorrow (because of the  sedation medicines used during the test).    FOLLOW UP: Our staff will call the number listed on your records the next business day following your procedure to check on you and address any questions or concerns that you may have regarding the information given to you following your procedure. If we do not reach you, we will leave a message.  However, if you are feeling well and you are not experiencing any problems, there is no need to return our call.  We will assume that you have returned to your regular daily activities without incident.  If any biopsies were taken you will be contacted by phone or by letter within the next 1-3 weeks.  Please call us at [phone] if you have not heard about the biopsies in 3 weeks.    SIGNATURES/CONFIDENTIALITY: You and/or your care partner have signed paperwork which will be entered into your electronic medical record.  These signatures attest to the fact that that the information above on your After Visit Summary has been reviewed and is understood.  Full responsibility of the confidentiality of this discharge information lies with you and/or your care-partner.

## 2018-12-13 ENCOUNTER — Telehealth: Payer: Self-pay

## 2018-12-13 NOTE — Telephone Encounter (Signed)
No answer, left message to call if having any issues or concerns, B.Ireene Ballowe RN 

## 2018-12-13 NOTE — Telephone Encounter (Signed)
First post procedure follow up call, no answer 

## 2018-12-19 MED FILL — ESCITALOPRAM 10 MG TABLET: 10 | 90 days supply | Qty: 90 | Fill #0

## 2019-01-08 ENCOUNTER — Ambulatory Visit: Payer: 59

## 2019-04-09 DIAGNOSIS — N390 Urinary tract infection, site not specified: Secondary | ICD-10-CM | POA: Diagnosis not present

## 2019-04-23 DIAGNOSIS — Z23 Encounter for immunization: Secondary | ICD-10-CM | POA: Diagnosis not present

## 2019-04-23 DIAGNOSIS — E7800 Pure hypercholesterolemia, unspecified: Secondary | ICD-10-CM | POA: Diagnosis not present

## 2019-04-23 DIAGNOSIS — Z79899 Other long term (current) drug therapy: Secondary | ICD-10-CM | POA: Diagnosis not present

## 2019-04-23 DIAGNOSIS — Z0000 Encounter for general adult medical examination without abnormal findings: Secondary | ICD-10-CM | POA: Diagnosis not present

## 2019-04-23 DIAGNOSIS — R102 Pelvic and perineal pain: Secondary | ICD-10-CM | POA: Diagnosis not present

## 2019-04-24 DIAGNOSIS — R1032 Left lower quadrant pain: Secondary | ICD-10-CM | POA: Diagnosis not present

## 2019-04-24 DIAGNOSIS — R102 Pelvic and perineal pain: Secondary | ICD-10-CM | POA: Diagnosis not present

## 2019-04-30 ENCOUNTER — Other Ambulatory Visit: Payer: Self-pay | Admitting: Geriatric Medicine

## 2019-04-30 DIAGNOSIS — R1030 Lower abdominal pain, unspecified: Secondary | ICD-10-CM

## 2019-05-08 ENCOUNTER — Ambulatory Visit
Admission: RE | Admit: 2019-05-08 | Discharge: 2019-05-08 | Disposition: A | Discharge status: Home / Self Care | Payer: 59 | Source: Ambulatory Visit | Attending: Geriatric Medicine | Admitting: Geriatric Medicine

## 2019-05-08 ENCOUNTER — Other Ambulatory Visit: Payer: Self-pay

## 2019-05-08 DIAGNOSIS — R1030 Lower abdominal pain, unspecified: Secondary | ICD-10-CM

## 2019-05-08 MED ORDER — IOPAMIDOL (ISOVUE-300) INJECTION 61%
100.0000 mL | Freq: Once | INTRAVENOUS | Status: AC | PRN
  Administered 2019-05-08: 100 mL via INTRAVENOUS

## 2019-06-22 MED FILL — ESCITALOPRAM 10 MG TABLET: 10 | 90 days supply | Qty: 90 | Fill #1

## 2019-08-03 ENCOUNTER — Other Ambulatory Visit: Payer: Self-pay

## 2019-08-03 ENCOUNTER — Ambulatory Visit
Admission: RE | Admit: 2019-08-03 | Discharge: 2019-08-03 | Disposition: A | Discharge status: Home / Self Care | Payer: 59 | Source: Ambulatory Visit | Attending: Obstetrics | Admitting: Obstetrics

## 2019-08-03 DIAGNOSIS — Z1231 Encounter for screening mammogram for malignant neoplasm of breast: Secondary | ICD-10-CM | POA: Diagnosis not present

## 2019-08-07 DIAGNOSIS — Z23 Encounter for immunization: Secondary | ICD-10-CM | POA: Diagnosis not present

## 2019-11-13 DIAGNOSIS — M25512 Pain in left shoulder: Secondary | ICD-10-CM | POA: Diagnosis not present

## 2019-11-13 DIAGNOSIS — M7502 Adhesive capsulitis of left shoulder: Secondary | ICD-10-CM | POA: Diagnosis not present

## 2019-11-29 DIAGNOSIS — M25512 Pain in left shoulder: Secondary | ICD-10-CM | POA: Diagnosis not present

## 2019-12-05 DIAGNOSIS — F4323 Adjustment disorder with mixed anxiety and depressed mood: Secondary | ICD-10-CM | POA: Diagnosis not present

## 2019-12-05 DIAGNOSIS — M25512 Pain in left shoulder: Secondary | ICD-10-CM | POA: Diagnosis not present

## 2019-12-07 DIAGNOSIS — M25512 Pain in left shoulder: Secondary | ICD-10-CM | POA: Diagnosis not present

## 2019-12-11 DIAGNOSIS — M25522 Pain in left elbow: Secondary | ICD-10-CM | POA: Diagnosis not present

## 2019-12-11 DIAGNOSIS — S52022A Displaced fracture of olecranon process without intraarticular extension of left ulna, initial encounter for closed fracture: Secondary | ICD-10-CM | POA: Diagnosis not present

## 2019-12-13 ENCOUNTER — Other Ambulatory Visit (HOSPITAL_COMMUNITY)
Admission: RE | Admit: 2019-12-13 | Discharge: 2019-12-13 | Disposition: A | Discharge status: Home / Self Care | Payer: 59 | Source: Ambulatory Visit | Attending: Orthopedic Surgery | Admitting: Orthopedic Surgery

## 2019-12-13 ENCOUNTER — Encounter (HOSPITAL_COMMUNITY): Payer: Self-pay | Admitting: Orthopedic Surgery

## 2019-12-13 ENCOUNTER — Other Ambulatory Visit: Payer: Self-pay

## 2019-12-13 DIAGNOSIS — Z01812 Encounter for preprocedural laboratory examination: Secondary | ICD-10-CM | POA: Diagnosis not present

## 2019-12-13 DIAGNOSIS — S52022A Displaced fracture of olecranon process without intraarticular extension of left ulna, initial encounter for closed fracture: Secondary | ICD-10-CM | POA: Diagnosis not present

## 2019-12-13 DIAGNOSIS — M25522 Pain in left elbow: Secondary | ICD-10-CM | POA: Diagnosis not present

## 2019-12-13 DIAGNOSIS — Z20822 Contact with and (suspected) exposure to covid-19: Secondary | ICD-10-CM | POA: Insufficient documentation

## 2019-12-13 LAB — SARS CORONAVIRUS 2 (TAT 6-24 HRS): SARS Coronavirus 2: NEGATIVE

## 2019-12-13 MED FILL — ONDANSETRON ODT 8 MG TABLET: 8 | 5 days supply | Qty: 15 | Fill #0

## 2019-12-13 MED FILL — CEPHALEXIN 500 MG CAPSULE: 500 | 14 days supply | Qty: 56 | Fill #0

## 2019-12-13 MED FILL — METHOCARBAMOL 500 MG TABS: 500 | 10 days supply | Qty: 40 | Fill #0

## 2019-12-13 MED FILL — oxyCODONE HCL 5 MG TABS: 5 | 7 days supply | Qty: 42 | Fill #0

## 2019-12-14 ENCOUNTER — Encounter (HOSPITAL_COMMUNITY): Payer: Self-pay | Admitting: Orthopedic Surgery

## 2019-12-14 NOTE — Progress Notes (Addendum)
Mrs Stephanie Benson denies chest pain or shortness of breath.  Mrs Stephanie Benson reports having a history of Irregular heart beat that can be tacky- occasionally. Irregular heart/tacky beat is not predicated by any activity, caffeine or stress. Patient reported that once it happened when she was at a wedding, very pleasant time.  Mrs Stephanie Benson states that the irregular / tacky rate beat happens 2-4 times a year, patient states it last a minute. The last time patient experienced the tacky irregular rate was a few weeks ago. Patient said that she saw Dr. Linard Millers many years ago, he told her to follow up if needed. I id not find a record in Epic about this visit. I did request last office note and EKG if there is one from PCP, Dr Lajean Manes.

## 2019-12-14 NOTE — Anesthesia Preprocedure Evaluation (Addendum)
Anesthesia Evaluation  Patient identified by MRN, date of birth, ID band Patient awake    Reviewed: Allergy & Precautions, NPO status , Patient's Chart, lab work & pertinent test results  Airway Mallampati: I  TM Distance: >3 FB Neck ROM: Full    Dental  (+) Teeth Intact, Dental Advisory Given   Pulmonary former smoker,    breath sounds clear to auscultation       Cardiovascular + dysrhythmias  Rhythm:Regular Rate:Normal     Neuro/Psych Anxiety negative neurological ROS     GI/Hepatic Neg liver ROS, GERD  ,  Endo/Other  negative endocrine ROS  Renal/GU negative Renal ROS     Musculoskeletal negative musculoskeletal ROS (+)   Abdominal Normal abdominal exam  (+)   Peds  Hematology negative hematology ROS (+)   Anesthesia Other Findings   Reproductive/Obstetrics                            Anesthesia Physical Anesthesia Plan  ASA: II  Anesthesia Plan: General   Post-op Pain Management: GA combined w/ Regional for post-op pain   Induction: Intravenous  PONV Risk Score and Plan: 4 or greater and Ondansetron, Dexamethasone, Midazolam and Scopolamine patch - Pre-op  Airway Management Planned:   Additional Equipment: None  Intra-op Plan:   Post-operative Plan: Extubation in OR  Informed Consent: I have reviewed the patients History and Physical, chart, labs and discussed the procedure including the risks, benefits and alternatives for the proposed anesthesia with the patient or authorized representative who has indicated his/her understanding and acceptance.       Plan Discussed with: CRNA  Anesthesia Plan Comments:        Anesthesia Quick Evaluation

## 2019-12-15 ENCOUNTER — Other Ambulatory Visit: Payer: Self-pay

## 2019-12-15 ENCOUNTER — Ambulatory Visit (HOSPITAL_COMMUNITY): Payer: 59 | Admitting: Anesthesiology

## 2019-12-15 ENCOUNTER — Ambulatory Visit (HOSPITAL_COMMUNITY)
Admission: RE | Admit: 2019-12-15 | Discharge: 2019-12-15 | Disposition: A | Discharge status: Home / Self Care | Payer: 59 | Attending: Orthopedic Surgery | Admitting: Orthopedic Surgery

## 2019-12-15 ENCOUNTER — Encounter (HOSPITAL_COMMUNITY): Payer: Self-pay | Admitting: Orthopedic Surgery

## 2019-12-15 ENCOUNTER — Encounter (HOSPITAL_COMMUNITY)
Admission: RE | Disposition: A | Discharge status: Home / Self Care | Payer: Self-pay | Source: Home / Self Care | Attending: Orthopedic Surgery

## 2019-12-15 DIAGNOSIS — S4402XA Injury of ulnar nerve at upper arm level, left arm, initial encounter: Secondary | ICD-10-CM | POA: Insufficient documentation

## 2019-12-15 DIAGNOSIS — F419 Anxiety disorder, unspecified: Secondary | ICD-10-CM | POA: Diagnosis not present

## 2019-12-15 DIAGNOSIS — Z85820 Personal history of malignant melanoma of skin: Secondary | ICD-10-CM | POA: Diagnosis not present

## 2019-12-15 DIAGNOSIS — K219 Gastro-esophageal reflux disease without esophagitis: Secondary | ICD-10-CM | POA: Diagnosis not present

## 2019-12-15 DIAGNOSIS — G8918 Other acute postprocedural pain: Secondary | ICD-10-CM | POA: Diagnosis not present

## 2019-12-15 DIAGNOSIS — Z87891 Personal history of nicotine dependence: Secondary | ICD-10-CM | POA: Diagnosis not present

## 2019-12-15 DIAGNOSIS — S46392A Other injury of muscle, fascia and tendon of triceps, left arm, initial encounter: Secondary | ICD-10-CM | POA: Diagnosis not present

## 2019-12-15 DIAGNOSIS — S52022A Displaced fracture of olecranon process without intraarticular extension of left ulna, initial encounter for closed fracture: Secondary | ICD-10-CM | POA: Diagnosis not present

## 2019-12-15 DIAGNOSIS — Z79899 Other long term (current) drug therapy: Secondary | ICD-10-CM | POA: Diagnosis not present

## 2019-12-15 DIAGNOSIS — G5622 Lesion of ulnar nerve, left upper limb: Secondary | ICD-10-CM | POA: Diagnosis not present

## 2019-12-15 DIAGNOSIS — S46312A Strain of muscle, fascia and tendon of triceps, left arm, initial encounter: Secondary | ICD-10-CM | POA: Diagnosis not present

## 2019-12-15 DIAGNOSIS — X58XXXA Exposure to other specified factors, initial encounter: Secondary | ICD-10-CM | POA: Insufficient documentation

## 2019-12-15 HISTORY — DX: Cardiac arrhythmia, unspecified: I49.9

## 2019-12-15 HISTORY — PX: TRICEPS TENDON REPAIR: SHX2577

## 2019-12-15 SURGERY — REPAIR, TENDON, TRICEPS
Anesthesia: General | Site: Elbow | Laterality: Left

## 2019-12-15 MED ORDER — CEFAZOLIN SODIUM-DEXTROSE 2-4 GM/100ML-% IV SOLN
INTRAVENOUS | Status: AC
  Filled 2019-12-15: qty 100

## 2019-12-15 MED ORDER — CHLORHEXIDINE GLUCONATE 4 % EX LIQD: 60.0000 mL | Freq: Once | CUTANEOUS | Status: DC

## 2019-12-15 MED ORDER — BUPIVACAINE-EPINEPHRINE (PF) 0.5% -1:200000 IJ SOLN
INTRAMUSCULAR | Status: DC | PRN
  Administered 2019-12-15: 30 mL via PERINEURAL

## 2019-12-15 MED ORDER — MIDAZOLAM HCL 2 MG/2ML IJ SOLN
INTRAMUSCULAR | Status: DC | PRN
  Administered 2019-12-15: 2 mg via INTRAVENOUS

## 2019-12-15 MED ORDER — HYDROMORPHONE HCL 1 MG/ML IJ SOLN: 0.2500 mg | INTRAMUSCULAR | Status: DC | PRN

## 2019-12-15 MED ORDER — SCOPOLAMINE 1 MG/3DAYS TD PT72
MEDICATED_PATCH | TRANSDERMAL | Status: DC | PRN
  Administered 2019-12-15: 1 via TRANSDERMAL

## 2019-12-15 MED ORDER — ROCURONIUM BROMIDE 10 MG/ML (PF) SYRINGE
PREFILLED_SYRINGE | INTRAVENOUS | Status: DC | PRN
  Administered 2019-12-15: 50 mg via INTRAVENOUS
  Administered 2019-12-15: 10 mg via INTRAVENOUS

## 2019-12-15 MED ORDER — PROPOFOL 10 MG/ML IV BOLUS
INTRAVENOUS | Status: DC | PRN
  Administered 2019-12-15: 130 mg via INTRAVENOUS

## 2019-12-15 MED ORDER — CEFAZOLIN SODIUM-DEXTROSE 2-4 GM/100ML-% IV SOLN
2.0000 g | INTRAVENOUS | Status: AC
  Administered 2019-12-15: 2 g via INTRAVENOUS

## 2019-12-15 MED ORDER — 0.9 % SODIUM CHLORIDE (POUR BTL) OPTIME
TOPICAL | Status: DC | PRN
  Administered 2019-12-15: 1000 mL

## 2019-12-15 MED ORDER — PROMETHAZINE HCL 25 MG/ML IJ SOLN: 6.2500 mg | INTRAMUSCULAR | Status: DC | PRN

## 2019-12-15 MED ORDER — LACTATED RINGERS IV SOLN: INTRAVENOUS | Status: DC

## 2019-12-15 MED ORDER — ACETAMINOPHEN 325 MG PO TABS: 325.0000 mg | ORAL_TABLET | Freq: Once | ORAL | Status: DC | PRN

## 2019-12-15 MED ORDER — ONDANSETRON HCL 4 MG/2ML IJ SOLN
INTRAMUSCULAR | Status: DC | PRN
  Administered 2019-12-15: 4 mg via INTRAVENOUS

## 2019-12-15 MED ORDER — ACETAMINOPHEN 160 MG/5ML PO SOLN: 325.0000 mg | Freq: Once | ORAL | Status: DC | PRN

## 2019-12-15 MED ORDER — SCOPOLAMINE 1 MG/3DAYS TD PT72
MEDICATED_PATCH | TRANSDERMAL | Status: AC
  Filled 2019-12-15: qty 1

## 2019-12-15 MED ORDER — PROPOFOL 10 MG/ML IV BOLUS
INTRAVENOUS | Status: AC
  Filled 2019-12-15: qty 40

## 2019-12-15 MED ORDER — DEXAMETHASONE SODIUM PHOSPHATE 10 MG/ML IJ SOLN
INTRAMUSCULAR | Status: DC | PRN
  Administered 2019-12-15: 8 mg via INTRAVENOUS

## 2019-12-15 MED ORDER — FENTANYL CITRATE (PF) 100 MCG/2ML IJ SOLN
INTRAMUSCULAR | Status: DC | PRN
  Administered 2019-12-15: 50 ug via INTRAVENOUS

## 2019-12-15 MED ORDER — LIDOCAINE 2% (20 MG/ML) 5 ML SYRINGE
INTRAMUSCULAR | Status: DC | PRN
  Administered 2019-12-15: 40 mg via INTRAVENOUS

## 2019-12-15 MED ORDER — EPHEDRINE SULFATE 50 MG/ML IJ SOLN
INTRAMUSCULAR | Status: DC | PRN
  Administered 2019-12-15 (×2): 5 mg via INTRAVENOUS

## 2019-12-15 MED ORDER — FENTANYL CITRATE (PF) 250 MCG/5ML IJ SOLN
INTRAMUSCULAR | Status: AC
  Filled 2019-12-15: qty 5

## 2019-12-15 MED ORDER — ACETAMINOPHEN 10 MG/ML IV SOLN: 1000.0000 mg | Freq: Once | INTRAVENOUS | Status: DC | PRN

## 2019-12-15 MED ORDER — MEPERIDINE HCL 25 MG/ML IJ SOLN: 6.2500 mg | INTRAMUSCULAR | Status: DC | PRN

## 2019-12-15 MED ORDER — MIDAZOLAM HCL 2 MG/2ML IJ SOLN
INTRAMUSCULAR | Status: AC
  Filled 2019-12-15: qty 2

## 2019-12-15 SURGICAL SUPPLY — 43 items
BNDG ELASTIC 3X5.8 VLCR STR LF (GAUZE/BANDAGES/DRESSINGS) IMPLANT
BNDG ELASTIC 4X5.8 VLCR STR LF (GAUZE/BANDAGES/DRESSINGS) ×6 IMPLANT
BNDG GAUZE ELAST 4 BULKY (GAUZE/BANDAGES/DRESSINGS) ×4 IMPLANT
CORD BIPOLAR FORCEPS 12FT (ELECTRODE) ×2 IMPLANT
COVER SURGICAL LIGHT HANDLE (MISCELLANEOUS) ×2 IMPLANT
CUFF TOURN SGL QUICK 18X4 (TOURNIQUET CUFF) ×2 IMPLANT
DRAPE INCISE IOBAN 66X45 STRL (DRAPES) ×2 IMPLANT
DRAPE OEC MINIVIEW 54X84 (DRAPES) ×2 IMPLANT
DRAPE U-SHAPE 47X51 STRL (DRAPES) ×2 IMPLANT
DRSG ADAPTIC 3X8 NADH LF (GAUZE/BANDAGES/DRESSINGS) ×2 IMPLANT
GAUZE SPONGE 4X4 12PLY STRL (GAUZE/BANDAGES/DRESSINGS) IMPLANT
GAUZE XEROFORM 1X8 LF (GAUZE/BANDAGES/DRESSINGS) IMPLANT
GAUZE XEROFORM 5X9 LF (GAUZE/BANDAGES/DRESSINGS) ×2 IMPLANT
GLOVE SS BIOGEL STRL SZ 8 (GLOVE) ×1 IMPLANT
GLOVE SUPERSENSE BIOGEL SZ 8 (GLOVE) ×1
GOWN STRL REUS W/ TWL XL LVL3 (GOWN DISPOSABLE) ×2 IMPLANT
GOWN STRL REUS W/TWL XL LVL3 (GOWN DISPOSABLE) ×2
IMPL SWIVELOCK 3.5X13.5 (Anchor) ×1 IMPLANT
IMPLANT SWIVELOCK 3.5X13.5 (Anchor) ×2 IMPLANT
KIT BASIN OR (CUSTOM PROCEDURE TRAY) ×2 IMPLANT
KIT SWIVELOCK DX 3.5X13.5 DISP (KITS) ×2 IMPLANT
KIT TURNOVER KIT B (KITS) ×2 IMPLANT
LOOP 2 FIBERLINK CLOSED (SUTURE) ×6 IMPLANT
MANIFOLD NEPTUNE II (INSTRUMENTS) ×2 IMPLANT
NEEDLE KEITH (NEEDLE) ×2 IMPLANT
NS IRRIG 1000ML POUR BTL (IV SOLUTION) ×2 IMPLANT
PACK ORTHO EXTREMITY (CUSTOM PROCEDURE TRAY) ×2 IMPLANT
PAD ARMBOARD 7.5X6 YLW CONV (MISCELLANEOUS) ×6 IMPLANT
PAD CAST 4YDX4 CTTN HI CHSV (CAST SUPPLIES) ×2 IMPLANT
PADDING CAST COTTON 4X4 STRL (CAST SUPPLIES) ×2
PADDING CAST COTTON 6X4 STRL (CAST SUPPLIES) ×2 IMPLANT
PASSER SUT SWANSON 36MM LOOP (INSTRUMENTS) IMPLANT
SOL PREP POV-IOD 4OZ 10% (MISCELLANEOUS) ×4 IMPLANT
SPLINT FIBERGLASS 4X30 (CAST SUPPLIES) ×2 IMPLANT
SUT FIBERWIRE #2 38 T-5 BLUE (SUTURE)
SUT PROLENE 3 0 PS 2 (SUTURE) ×6 IMPLANT
SUT PROLENE 4 0 PS 2 18 (SUTURE) ×2 IMPLANT
SUT VIC AB 3-0 FS2 27 (SUTURE) ×4 IMPLANT
SUTURE FIBERWR #2 38 T-5 BLUE (SUTURE) IMPLANT
TOWEL GREEN STERILE (TOWEL DISPOSABLE) ×2 IMPLANT
TUBE CONNECTING 12X1/4 (SUCTIONS) ×2 IMPLANT
UNDERPAD 30X30 (UNDERPADS AND DIAPERS) ×2 IMPLANT
WATER STERILE IRR 1000ML POUR (IV SOLUTION) ×2 IMPLANT

## 2019-12-15 NOTE — Discharge Instructions (Signed)
Please elevate move and massage her fingers.  Ice is fine to use at the elbow however given the bulkiness of your bandage the elevation movement and massage of the fingers will be more important.  Your arm will be in an awkward position as the extended position is protective of your repair.  We do not want you to flex her elbow until you are seen back by Dr. Amedeo Plenty.  Please call for any problems.  Take the pain medicine as needed and as directed.  We recommend that you to take vitamin C 1000 mg a day to promote healing. We also recommend that if you require  pain medicine that you take a stool softener to prevent constipation as most pain medicines will have constipation side effects. We recommend either Peri-Colace or Senokot and recommend that you also consider adding MiraLAX as well to prevent the constipation affects from pain medicine if you are required to use them. These medicines are over the counter and may be purchased at a local pharmacy. A cup of yogurt and a probiotic can also be helpful during the recovery process as the medicines can disrupt your intestinal environment. Keep bandage clean and dry.  Call for any problems.  No smoking.  Criteria for driving a car: you should be off your pain medicine for 7-8 hours, able to drive one handed(confident), thinking clearly and feeling able in your judgement to drive. Continue elevation as it will decrease swelling.  If instructed by MD move your fingers within the confines of the bandage/splint.  Use ice if instructed by your MD. Call immediately for any sudden loss of feeling in your hand/arm or change in functional abilities of the extremity.

## 2019-12-15 NOTE — Anesthesia Procedure Notes (Signed)
Procedure Name: Intubation Date/Time: 12/15/2019 8:03 AM Performed by: Leonor Liv, CRNA Pre-anesthesia Checklist: Patient identified, Emergency Drugs available, Suction available and Patient being monitored Patient Re-evaluated:Patient Re-evaluated prior to induction Oxygen Delivery Method: Circle System Utilized Preoxygenation: Pre-oxygenation with 100% oxygen Induction Type: IV induction Ventilation: Mask ventilation without difficulty Laryngoscope Size: Mac and 3 Grade View: Grade I Tube type: Oral Tube size: 7.0 mm Number of attempts: 1 Airway Equipment and Method: Stylet and Oral airway Placement Confirmation: ETT inserted through vocal cords under direct vision,  positive ETCO2 and breath sounds checked- equal and bilateral Secured at: 21 cm Tube secured with: Tape Dental Injury: Teeth and Oropharynx as per pre-operative assessment

## 2019-12-15 NOTE — Transfer of Care (Signed)
Immediate Anesthesia Transfer of Care Note  Patient: Stephanie Benson  Procedure(s) Performed: Open repair triceps tendon and bony fracture repair with ulnar nerve release left elbow (Left Elbow)  Patient Location: PACU  Anesthesia Type:General  Level of Consciousness: awake, alert  and oriented  Airway & Oxygen Therapy: Patient Spontanous Breathing and Patient connected to nasal cannula oxygen  Post-op Assessment: Report given to RN, Post -op Vital signs reviewed and stable and Patient moving all extremities  Post vital signs: Reviewed and stable  Last Vitals:  Vitals Value Taken Time  BP    Temp    Pulse    Resp    SpO2      Last Pain:  Vitals:   12/15/19 [id number]  TempSrc: Oral  PainSc: 0-No pain      Patients Stated Pain Goal: 3 (0000000 [id number])  Complications: No apparent anesthesia complications

## 2019-12-15 NOTE — H&P (Signed)
Stephanie Benson is an 59 y.o. female.   Chief Complaint: Left elbow fracture with triceps evulsion and olecranon avulsion HPI: Patient presents for evaluation and treatment of the of their upper extremity predicament. The patient denies neck, back, chest or  abdominal pain. The patient notes that they have no lower extremity problems. The patients primary complaint is noted. We are planning surgical care pathway for the upper extremity.  Patient presents for surgical management of her left elbow.    Past Medical History:  Diagnosis Date  . Allergy   . Anxiety 09-27-11   no panic attacks-tx. Lexapro  . Dysrhythmia    irregular - "sometimes tacky"  . GERD (gastroesophageal reflux disease) 09-27-11    past,no meds in 5 yrs  . Hyperlipidemia   . Melanoma ([place]) 09-27-11   left ankle, basal -face  . Thumb injury 09-27-11   cut Rt. Thumb on broken glass, is stitched  . Tubular adenoma of colon     Past Surgical History:  Procedure Laterality Date  . COLONOSCOPY     x2  . COLONOSCOPY W/ POLYPECTOMY    . ear surgery 2008 left    . I & D EXTREMITY  09/28/2011   Procedure: IRRIGATION AND DEBRIDEMENT EXTREMITY;  Surgeon: Willa Frater III;  Location: [facility];  Service: Orthopedics;  Laterality: Right;  Irrigation and Debridement   . MELANOMA EXCISION Left    ankle  . VARICOSE VEIN SURGERY Bilateral    2x    Family History  Problem Relation Age of Onset  . Diabetes Mother   . Hypertension Mother   . Hypertension Father   . Heart disease Father   . Heart failure Father   . Diabetes Maternal Grandmother   . Diabetes Maternal Grandfather   . Diabetes Paternal Grandmother   . Heart disease Paternal Grandmother   . Cancer Paternal Grandmother        pancreatic  . Pancreatic cancer Paternal Grandmother   . Diabetes Paternal Grandfather   . Rectal cancer Neg Hx   . Stomach cancer Neg Hx   . Colon polyps Neg Hx   . Colon cancer Neg Hx   . Esophageal cancer Neg Hx   . Breast  cancer Neg Hx    Social History:  reports that she has quit smoking. She has never used smokeless tobacco. She reports current alcohol use of about 3.0 standard drinks of alcohol per week. She reports that she does not use drugs.  Allergies: No Known Allergies  Medications Prior to Admission  Medication Sig Dispense Refill  . escitalopram (LEXAPRO) 10 MG tablet Take 10 mg by mouth daily.     Marland Kitchen ibuprofen (ADVIL,MOTRIN) 200 MG tablet Take 400 mg by mouth every 6 (six) hours as needed for pain or headache.      Results for orders placed or performed during the hospital encounter of 12/13/19 (from the past 48 hour(s))  SARS CORONAVIRUS 2 (TAT 6-24 HRS) Nasopharyngeal Nasopharyngeal Swab     Status: None   Collection Time: 12/13/19  9:57 AM   Specimen: Nasopharyngeal Swab  Result Value Ref Range   SARS Coronavirus 2 NEGATIVE NEGATIVE    Comment: (NOTE) SARS-CoV-2 target nucleic acids are NOT DETECTED. The SARS-CoV-2 RNA is generally detectable in upper and lower respiratory specimens during the acute phase of infection. Negative results do not preclude SARS-CoV-2 infection, do not rule out co-infections with other pathogens, and should not be used as the sole basis for treatment or other patient  management decisions. Negative results must be combined with clinical observations, patient history, and epidemiological information. The expected result is Negative. Fact Sheet for Patients: SugarRoll.be Fact Sheet for Healthcare Providers: https://www.woods-mathews.com/ This test is not yet approved or cleared by the [place] FDA and  has been authorized for detection and/or diagnosis of SARS-CoV-2 by FDA under an Emergency Use Authorization (EUA). This EUA will remain  in effect (meaning this test can be used) for the duration of the COVID-19 declaration under Section 56 4(b)(1) of the Act, 21 U.S.C. section 360bbb-3(b)(1), unless the  authorization is terminated or revoked sooner. Performed at [facility], [place] [address] [place], [place] [address]    No results found.  Review of Systems  Respiratory: Negative.   Cardiovascular: Negative.   Gastrointestinal: Negative.     Blood pressure (!) 114/95, pulse 70, temperature 97.9 F (36.6 C), temperature source Oral, resp. rate 16, height 5\' 7"  (1.702 m), weight 68 kg, last menstrual period 10/19/2007, SpO2 100 %. Physical Exam  Swollen tender left elbow with triceps and olecranon avulsion injury.  Patient has intact sensation.  Motor function about the elbow is difficult to assess due to pain.  Patient has no evidence of compartment syndrome.  There is no evidence of infection.  I reviewed all issues with patient at length.  We have had a thorough counseling session in regards to risk-benefit profile there is and don'ts and other aspects of her care plan  The patient is alert and oriented in no acute distress. The patient complains of pain in the affected upper extremity.  The patient is noted to have a normal HEENT exam. Lung fields show equal chest expansion and no shortness of breath. Abdomen exam is nontender without distention. Lower extremity examination does not show any fracture dislocation or blood clot symptoms. Pelvis is stable and the neck and back are stable and nontender. Assessment/Plan We will plan for open repair left elbow with ulnar nerve release and repair reconstruction is necessary.  I outlined the risk and benefit profile do's and don'ts and all questions have been encouraged and answered.  We are planning surgery for your upper extremity. The risk and benefits of surgery to include risk of bleeding, infection, anesthesia,  damage to normal structures and failure of the surgery to accomplish its intended goals of relieving symptoms and restoring function have been discussed in detail. With this in mind we plan to proceed. I have  specifically discussed with the patient the pre-and postoperative regime and the dos and don'ts and risk and benefits in great detail. Risk and benefits of surgery also include risk of dystrophy(CRPS), chronic nerve pain, failure of the healing process to go onto completion and other inherent risks of surgery The relavent the pathophysiology of the disease/injury process, as well as the alternatives for treatment and postoperative course of action has been discussed in great detail with the patient who desires to proceed.  We will do everything in our power to help you (the patient) restore function to the upper extremity. It is a pleasure to see this patient today.   Willa Frater III, MD 12/15/2019, 7:50 AM

## 2019-12-15 NOTE — Op Note (Signed)
Operative note December 15, 2019  Preoperative diagnosis left elbow triceps avulsion and olecranon fracture with pain swelling and dysfunction  Postop diagnosis: The same  Operative procedure: #1 ulnar nerve release left elbow in situ #2 excision olecranon tip fracture secondary to marked comminution #3 triceps tendon repair reconstruction with transosseous technique from Arthrex #4 4 view radiographic series left elbow performed examined and interpreted by myself.  Surgeon Roseanne Kaufman.  Anesthesia General with preoperative block.  Estimated blood loss minimal.  Tourniquet time less than an hour.  Indications for the procedure this patient had a significant injury to her elbow.  X-ray and MRI scan been reviewed by myself.  I discussed with her risk and benefit profile of surgical and nonsurgical intervention and will proceed accordingly.  We have discussed relevant issues findings and concerns.  At present juncture I discussed the risk-benefit profile at length and the proposed surgical procedure.  With all in mind she desires to proceed.  Operative procedure in detail.  Patient underwent a smooth induction of block anesthetic.  She was counseled extensively in the holding area and then taken to the operative theater.  She underwent a anesthetic general in nature and then was carefully padded with foam and pillow and turned on her side with axillary roll.  SCDs were placed on her lower extremities and all body parts were well-padded.  Following this she was prepped with 2 separate Hibiclens scrubs followed by 10-minute surgical Betadine scrub and paint all performed by myself.  Once this was complete the patient then underwent final timeout.  Antibiotics were given and there were no complicating features.  The operation commenced with elevation of the arm and tourniquet sterile in nature to 250 mmHg.  Extensile incision was accomplished.  She had a large amount of bloody hematoma in the  area in question.  The triceps was avulsed with olecranon tip fracture.  The patient had marked edema and due to the swelling and proximity of her ulnar nerve we performed a in situ ulnar nerve release about the arcade of Struthers, medial intermuscular septum, cubital tunnel, Osborne's ligament, and the 2 heads of the FCU.  I should note the ulnar nerve was carefully protected and released without difficulty.  I was pleased with the release.  I did note some contusive injury to the nerve and some bruising within the epineurium.  Following this we then performed a partial excision of the olecranon chip fractures.  The patient had a large amount of jagged olecranon tip fractures and this was excised.  Once the excision was accomplished I then irrigated copiously.  Once this was complete I then set out to repair the triceps and reinserted into the olecranon.  A Krakauer stitch was placed from proximal to distal with 2 exiting limbs on the lateral side and a second stitch with 2 exiting limbs on the medial side was then accomplished.  Once this was complete a fiber loop/fiber link stitch was placed.  Once this was accomplished we then performed placement of a 2.0 mm drill hole from proximal to distal exiting off of the ulnar cortex.  The 3 limbs medial were passed through the medial drill hole and the 3 limbs lateral were passed through the lateral drill hole according to the Arthrex transosseous technique.  Following this we then placed 1 medial and 1 lateral fiber wire through the fiber link loop and then advanced it.  Similarly this was performed on the lateral side.  This created a pants over  vest technique and a suture bridge as well as direct transosseous repair.  This was pulled down with the arm in extension and following this the exiting limbs were secured with a 3 5 swivel lock that was pretapped drilled and sat nicely.  I checked the repair for tension and made sure that we had good tension on all  areas.  She looked very well she was stable to 90 degrees and had no complications.  This was a good stat repair and I was quite pleased with this.  I then checked the ulnar nerve and performed copious irrigation  Tourniquet was deflated hemostasis secured and the wound closed with Prolene.  She was placed in a long-arm splint at slight flexion only to take tension off our repair.  She will be discharged home on Keflex, Zofran, oxycodone, Robaxin.  She will see [place] in 14 to 16 days and a triceps protocol be adhered to.  I discussed all issues with her husband who is a physician.  Lariyah Shetterly MD

## 2019-12-15 NOTE — Anesthesia Procedure Notes (Signed)
Anesthesia Regional Block: Supraclavicular block   Pre-Anesthetic Checklist: ,, timeout performed, Correct Patient, Correct Site, Correct Laterality, Correct Procedure, Correct Position, site marked, Risks and benefits discussed,  Surgical consent,  Pre-op evaluation,  At surgeon's request and post-op pain management  Laterality: Left  Prep: chloraprep       Needles:  Injection technique: Single-shot  Needle Type: Echogenic Stimulator Needle     Needle Length: 9cm  Needle Gauge: 21     Additional Needles:   Procedures:,,,, ultrasound used (permanent image in chart),,,,  Narrative:  Start time: 12/15/2019 7:15 AM End time: 12/15/2019 7:22 AM Injection made incrementally with aspirations every 5 mL.  Performed by: Personally  Anesthesiologist: Effie Berkshire, MD  Additional Notes: Patient tolerated the procedure well. Local anesthetic introduced in an incremental fashion under minimal resistance after negative aspirations. No paresthesias were elicited. After completion of the procedure, no acute issues were identified and patient continued to be monitored by RN.

## 2019-12-15 NOTE — Anesthesia Postprocedure Evaluation (Signed)
Anesthesia Post Note  Patient: Stephanie Benson  Procedure(s) Performed: Open repair triceps tendon and bony fracture repair with ulnar nerve release left elbow (Left Elbow)     Patient location during evaluation: PACU Anesthesia Type: General Level of consciousness: awake and alert Pain management: pain level controlled Vital Signs Assessment: post-procedure vital signs reviewed and stable Respiratory status: spontaneous breathing, nonlabored ventilation, respiratory function stable and patient connected to nasal cannula oxygen Cardiovascular status: blood pressure returned to baseline and stable Postop Assessment: no apparent nausea or vomiting Anesthetic complications: no    Last Vitals:  Vitals:   12/15/19 1008 12/15/19 1025  BP: 124/76 124/80  Pulse: 76 75  Resp: 18 19  Temp: 36.7 C 36.7 C  SpO2: 100% 98%    Last Pain:  Vitals:   12/15/19 1025  TempSrc:   PainSc: 0-No pain                 Effie Berkshire

## 2019-12-17 ENCOUNTER — Encounter: Payer: Self-pay | Admitting: *Deleted

## 2019-12-19 DIAGNOSIS — F4323 Adjustment disorder with mixed anxiety and depressed mood: Secondary | ICD-10-CM | POA: Diagnosis not present

## 2019-12-26 DIAGNOSIS — F4323 Adjustment disorder with mixed anxiety and depressed mood: Secondary | ICD-10-CM | POA: Diagnosis not present

## 2019-12-31 DIAGNOSIS — S52022D Displaced fracture of olecranon process without intraarticular extension of left ulna, subsequent encounter for closed fracture with routine healing: Secondary | ICD-10-CM | POA: Diagnosis not present

## 2019-12-31 DIAGNOSIS — M25522 Pain in left elbow: Secondary | ICD-10-CM | POA: Diagnosis not present

## 2019-12-31 DIAGNOSIS — Z4789 Encounter for other orthopedic aftercare: Secondary | ICD-10-CM | POA: Diagnosis not present

## 2020-01-07 MED FILL — ESCITALOPRAM 10 MG TABLET: 10 | 90 days supply | Qty: 90 | Fill #0

## 2020-01-09 DIAGNOSIS — M25522 Pain in left elbow: Secondary | ICD-10-CM | POA: Diagnosis not present

## 2020-01-11 DIAGNOSIS — F4323 Adjustment disorder with mixed anxiety and depressed mood: Secondary | ICD-10-CM | POA: Diagnosis not present

## 2020-01-17 DIAGNOSIS — M25522 Pain in left elbow: Secondary | ICD-10-CM | POA: Diagnosis not present

## 2020-01-18 DIAGNOSIS — F4323 Adjustment disorder with mixed anxiety and depressed mood: Secondary | ICD-10-CM | POA: Diagnosis not present

## 2020-01-24 DIAGNOSIS — M25522 Pain in left elbow: Secondary | ICD-10-CM | POA: Diagnosis not present

## 2020-01-29 DIAGNOSIS — M25522 Pain in left elbow: Secondary | ICD-10-CM | POA: Diagnosis not present

## 2020-01-31 DIAGNOSIS — F4323 Adjustment disorder with mixed anxiety and depressed mood: Secondary | ICD-10-CM | POA: Diagnosis not present

## 2020-02-05 DIAGNOSIS — M25522 Pain in left elbow: Secondary | ICD-10-CM | POA: Diagnosis not present

## 2020-02-06 DIAGNOSIS — F4323 Adjustment disorder with mixed anxiety and depressed mood: Secondary | ICD-10-CM | POA: Diagnosis not present

## 2020-02-07 DIAGNOSIS — M25522 Pain in left elbow: Secondary | ICD-10-CM | POA: Diagnosis not present

## 2020-02-12 DIAGNOSIS — M25522 Pain in left elbow: Secondary | ICD-10-CM | POA: Diagnosis not present

## 2020-02-13 DIAGNOSIS — F4323 Adjustment disorder with mixed anxiety and depressed mood: Secondary | ICD-10-CM | POA: Diagnosis not present

## 2020-02-14 DIAGNOSIS — M25522 Pain in left elbow: Secondary | ICD-10-CM | POA: Diagnosis not present

## 2020-02-18 DIAGNOSIS — M25522 Pain in left elbow: Secondary | ICD-10-CM | POA: Diagnosis not present

## 2020-02-20 DIAGNOSIS — M25522 Pain in left elbow: Secondary | ICD-10-CM | POA: Diagnosis not present

## 2020-02-20 DIAGNOSIS — F4323 Adjustment disorder with mixed anxiety and depressed mood: Secondary | ICD-10-CM | POA: Diagnosis not present

## 2020-02-21 DIAGNOSIS — M25522 Pain in left elbow: Secondary | ICD-10-CM | POA: Diagnosis not present

## 2020-02-21 DIAGNOSIS — S52022D Displaced fracture of olecranon process without intraarticular extension of left ulna, subsequent encounter for closed fracture with routine healing: Secondary | ICD-10-CM | POA: Diagnosis not present

## 2020-02-21 DIAGNOSIS — M7502 Adhesive capsulitis of left shoulder: Secondary | ICD-10-CM | POA: Diagnosis not present

## 2020-02-21 DIAGNOSIS — Z4789 Encounter for other orthopedic aftercare: Secondary | ICD-10-CM | POA: Diagnosis not present

## 2020-02-21 MED FILL — traMADol HCL 50 MG TABS: 50 | 8 days supply | Qty: 50 | Fill #0

## 2020-02-22 DIAGNOSIS — D1801 Hemangioma of skin and subcutaneous tissue: Secondary | ICD-10-CM | POA: Diagnosis not present

## 2020-02-22 DIAGNOSIS — L821 Other seborrheic keratosis: Secondary | ICD-10-CM | POA: Diagnosis not present

## 2020-02-22 DIAGNOSIS — B372 Candidiasis of skin and nail: Secondary | ICD-10-CM | POA: Diagnosis not present

## 2020-02-22 DIAGNOSIS — Z85820 Personal history of malignant melanoma of skin: Secondary | ICD-10-CM | POA: Diagnosis not present

## 2020-02-22 DIAGNOSIS — D2239 Melanocytic nevi of other parts of face: Secondary | ICD-10-CM | POA: Diagnosis not present

## 2020-02-22 DIAGNOSIS — L814 Other melanin hyperpigmentation: Secondary | ICD-10-CM | POA: Diagnosis not present

## 2020-02-22 DIAGNOSIS — L819 Disorder of pigmentation, unspecified: Secondary | ICD-10-CM | POA: Diagnosis not present

## 2020-02-22 DIAGNOSIS — Z85828 Personal history of other malignant neoplasm of skin: Secondary | ICD-10-CM | POA: Diagnosis not present

## 2020-02-22 MED FILL — KETOCONAZOLE 2% CREAM: 2 | 15 days supply | Qty: 30 | Fill #0

## 2020-02-25 DIAGNOSIS — M25522 Pain in left elbow: Secondary | ICD-10-CM | POA: Diagnosis not present

## 2020-02-27 DIAGNOSIS — M25522 Pain in left elbow: Secondary | ICD-10-CM | POA: Diagnosis not present

## 2020-02-28 DIAGNOSIS — M25522 Pain in left elbow: Secondary | ICD-10-CM | POA: Diagnosis not present

## 2020-03-03 DIAGNOSIS — M25522 Pain in left elbow: Secondary | ICD-10-CM | POA: Diagnosis not present

## 2020-03-05 DIAGNOSIS — M25522 Pain in left elbow: Secondary | ICD-10-CM | POA: Diagnosis not present

## 2020-03-05 DIAGNOSIS — F4323 Adjustment disorder with mixed anxiety and depressed mood: Secondary | ICD-10-CM | POA: Diagnosis not present

## 2020-03-06 DIAGNOSIS — M25522 Pain in left elbow: Secondary | ICD-10-CM | POA: Diagnosis not present

## 2020-03-10 DIAGNOSIS — M25522 Pain in left elbow: Secondary | ICD-10-CM | POA: Diagnosis not present

## 2020-03-12 DIAGNOSIS — M25522 Pain in left elbow: Secondary | ICD-10-CM | POA: Diagnosis not present

## 2020-03-13 DIAGNOSIS — S52022D Displaced fracture of olecranon process without intraarticular extension of left ulna, subsequent encounter for closed fracture with routine healing: Secondary | ICD-10-CM | POA: Diagnosis not present

## 2020-03-13 DIAGNOSIS — Z4789 Encounter for other orthopedic aftercare: Secondary | ICD-10-CM | POA: Diagnosis not present

## 2020-03-13 DIAGNOSIS — M25522 Pain in left elbow: Secondary | ICD-10-CM | POA: Diagnosis not present

## 2020-03-14 DIAGNOSIS — F4323 Adjustment disorder with mixed anxiety and depressed mood: Secondary | ICD-10-CM | POA: Diagnosis not present

## 2020-03-24 DIAGNOSIS — M25522 Pain in left elbow: Secondary | ICD-10-CM | POA: Diagnosis not present

## 2020-03-25 DIAGNOSIS — F4323 Adjustment disorder with mixed anxiety and depressed mood: Secondary | ICD-10-CM | POA: Diagnosis not present

## 2020-03-26 DIAGNOSIS — M25522 Pain in left elbow: Secondary | ICD-10-CM | POA: Diagnosis not present

## 2020-03-27 DIAGNOSIS — S52022D Displaced fracture of olecranon process without intraarticular extension of left ulna, subsequent encounter for closed fracture with routine healing: Secondary | ICD-10-CM | POA: Diagnosis not present

## 2020-03-27 DIAGNOSIS — M7502 Adhesive capsulitis of left shoulder: Secondary | ICD-10-CM | POA: Diagnosis not present

## 2020-03-27 DIAGNOSIS — M25522 Pain in left elbow: Secondary | ICD-10-CM | POA: Diagnosis not present

## 2020-03-27 DIAGNOSIS — M25512 Pain in left shoulder: Secondary | ICD-10-CM | POA: Diagnosis not present

## 2020-03-31 DIAGNOSIS — M25522 Pain in left elbow: Secondary | ICD-10-CM | POA: Diagnosis not present

## 2020-04-02 DIAGNOSIS — F4323 Adjustment disorder with mixed anxiety and depressed mood: Secondary | ICD-10-CM | POA: Diagnosis not present

## 2020-04-02 DIAGNOSIS — M25522 Pain in left elbow: Secondary | ICD-10-CM | POA: Diagnosis not present

## 2020-04-03 DIAGNOSIS — M25522 Pain in left elbow: Secondary | ICD-10-CM | POA: Diagnosis not present

## 2020-04-07 DIAGNOSIS — M25522 Pain in left elbow: Secondary | ICD-10-CM | POA: Diagnosis not present

## 2020-04-09 DIAGNOSIS — M25522 Pain in left elbow: Secondary | ICD-10-CM | POA: Diagnosis not present

## 2020-04-10 DIAGNOSIS — M25522 Pain in left elbow: Secondary | ICD-10-CM | POA: Diagnosis not present

## 2020-04-14 DIAGNOSIS — M25522 Pain in left elbow: Secondary | ICD-10-CM | POA: Diagnosis not present

## 2020-04-16 DIAGNOSIS — M25522 Pain in left elbow: Secondary | ICD-10-CM | POA: Diagnosis not present

## 2020-04-16 DIAGNOSIS — F4323 Adjustment disorder with mixed anxiety and depressed mood: Secondary | ICD-10-CM | POA: Diagnosis not present

## 2020-04-17 DIAGNOSIS — M25522 Pain in left elbow: Secondary | ICD-10-CM | POA: Diagnosis not present

## 2020-04-22 DIAGNOSIS — M25522 Pain in left elbow: Secondary | ICD-10-CM | POA: Diagnosis not present

## 2020-04-23 DIAGNOSIS — F4323 Adjustment disorder with mixed anxiety and depressed mood: Secondary | ICD-10-CM | POA: Diagnosis not present

## 2020-04-24 DIAGNOSIS — Z4789 Encounter for other orthopedic aftercare: Secondary | ICD-10-CM | POA: Diagnosis not present

## 2020-04-24 DIAGNOSIS — M7502 Adhesive capsulitis of left shoulder: Secondary | ICD-10-CM | POA: Diagnosis not present

## 2020-04-24 DIAGNOSIS — M25522 Pain in left elbow: Secondary | ICD-10-CM | POA: Diagnosis not present

## 2020-04-24 DIAGNOSIS — S52022D Displaced fracture of olecranon process without intraarticular extension of left ulna, subsequent encounter for closed fracture with routine healing: Secondary | ICD-10-CM | POA: Diagnosis not present

## 2020-04-29 DIAGNOSIS — M25522 Pain in left elbow: Secondary | ICD-10-CM | POA: Diagnosis not present

## 2020-05-01 DIAGNOSIS — M25522 Pain in left elbow: Secondary | ICD-10-CM | POA: Diagnosis not present

## 2020-05-02 DIAGNOSIS — F4323 Adjustment disorder with mixed anxiety and depressed mood: Secondary | ICD-10-CM | POA: Diagnosis not present

## 2020-05-05 DIAGNOSIS — M25522 Pain in left elbow: Secondary | ICD-10-CM | POA: Diagnosis not present

## 2020-05-09 DIAGNOSIS — F4323 Adjustment disorder with mixed anxiety and depressed mood: Secondary | ICD-10-CM | POA: Diagnosis not present

## 2020-05-19 DIAGNOSIS — M25522 Pain in left elbow: Secondary | ICD-10-CM | POA: Diagnosis not present

## 2020-05-21 DIAGNOSIS — M25522 Pain in left elbow: Secondary | ICD-10-CM | POA: Diagnosis not present

## 2020-05-28 DIAGNOSIS — M25522 Pain in left elbow: Secondary | ICD-10-CM | POA: Diagnosis not present

## 2020-05-29 DIAGNOSIS — M25522 Pain in left elbow: Secondary | ICD-10-CM | POA: Diagnosis not present

## 2020-06-03 ENCOUNTER — Other Ambulatory Visit: Payer: Self-pay | Admitting: Urology

## 2020-06-03 ENCOUNTER — Other Ambulatory Visit: Payer: Self-pay | Admitting: Geriatric Medicine

## 2020-06-03 DIAGNOSIS — Z1231 Encounter for screening mammogram for malignant neoplasm of breast: Secondary | ICD-10-CM

## 2020-06-04 DIAGNOSIS — F4323 Adjustment disorder with mixed anxiety and depressed mood: Secondary | ICD-10-CM | POA: Diagnosis not present

## 2020-06-04 DIAGNOSIS — M25522 Pain in left elbow: Secondary | ICD-10-CM | POA: Diagnosis not present

## 2020-06-11 DIAGNOSIS — F4323 Adjustment disorder with mixed anxiety and depressed mood: Secondary | ICD-10-CM | POA: Diagnosis not present

## 2020-06-11 DIAGNOSIS — M25522 Pain in left elbow: Secondary | ICD-10-CM | POA: Diagnosis not present

## 2020-06-16 DIAGNOSIS — M25522 Pain in left elbow: Secondary | ICD-10-CM | POA: Diagnosis not present

## 2020-06-16 DIAGNOSIS — F4323 Adjustment disorder with mixed anxiety and depressed mood: Secondary | ICD-10-CM | POA: Diagnosis not present

## 2020-06-18 DIAGNOSIS — Z1159 Encounter for screening for other viral diseases: Secondary | ICD-10-CM | POA: Diagnosis not present

## 2020-06-18 DIAGNOSIS — Z79899 Other long term (current) drug therapy: Secondary | ICD-10-CM | POA: Diagnosis not present

## 2020-06-18 DIAGNOSIS — E7800 Pure hypercholesterolemia, unspecified: Secondary | ICD-10-CM | POA: Diagnosis not present

## 2020-06-18 DIAGNOSIS — I493 Ventricular premature depolarization: Secondary | ICD-10-CM | POA: Diagnosis not present

## 2020-06-18 DIAGNOSIS — Z0000 Encounter for general adult medical examination without abnormal findings: Secondary | ICD-10-CM | POA: Diagnosis not present

## 2020-06-19 DIAGNOSIS — M25522 Pain in left elbow: Secondary | ICD-10-CM | POA: Diagnosis not present

## 2020-06-25 DIAGNOSIS — M25522 Pain in left elbow: Secondary | ICD-10-CM | POA: Diagnosis not present

## 2020-06-25 DIAGNOSIS — F4323 Adjustment disorder with mixed anxiety and depressed mood: Secondary | ICD-10-CM | POA: Diagnosis not present

## 2020-06-26 DIAGNOSIS — M25512 Pain in left shoulder: Secondary | ICD-10-CM | POA: Diagnosis not present

## 2020-06-26 DIAGNOSIS — M7502 Adhesive capsulitis of left shoulder: Secondary | ICD-10-CM | POA: Diagnosis not present

## 2020-06-26 DIAGNOSIS — S52022D Displaced fracture of olecranon process without intraarticular extension of left ulna, subsequent encounter for closed fracture with routine healing: Secondary | ICD-10-CM | POA: Diagnosis not present

## 2020-06-30 DIAGNOSIS — M25522 Pain in left elbow: Secondary | ICD-10-CM | POA: Diagnosis not present

## 2020-07-09 DIAGNOSIS — F4323 Adjustment disorder with mixed anxiety and depressed mood: Secondary | ICD-10-CM | POA: Diagnosis not present

## 2020-07-23 DIAGNOSIS — F4323 Adjustment disorder with mixed anxiety and depressed mood: Secondary | ICD-10-CM | POA: Diagnosis not present

## 2020-07-29 DIAGNOSIS — M7502 Adhesive capsulitis of left shoulder: Secondary | ICD-10-CM | POA: Diagnosis not present

## 2020-08-04 ENCOUNTER — Ambulatory Visit (INDEPENDENT_AMBULATORY_CARE_PROVIDER_SITE_OTHER): Payer: 59 | Admitting: Family Medicine

## 2020-08-04 ENCOUNTER — Encounter: Payer: Self-pay | Admitting: Family Medicine

## 2020-08-04 ENCOUNTER — Other Ambulatory Visit: Payer: Self-pay

## 2020-08-04 ENCOUNTER — Ambulatory Visit
Admission: RE | Admit: 2020-08-04 | Discharge: 2020-08-04 | Disposition: A | Discharge status: Home / Self Care | Payer: 59 | Source: Ambulatory Visit | Attending: Geriatric Medicine | Admitting: Geriatric Medicine

## 2020-08-04 DIAGNOSIS — Z1231 Encounter for screening mammogram for malignant neoplasm of breast: Secondary | ICD-10-CM | POA: Diagnosis not present

## 2020-08-04 DIAGNOSIS — Z713 Dietary counseling and surveillance: Secondary | ICD-10-CM

## 2020-08-04 NOTE — Progress Notes (Signed)
Medical Nutrition Therapy Appt start time: 1430 end time: 4174 (1 hour) Patient has completed 2nd dose of COVID-19 vaccine March 2021. Therapist Lennart Pall, PhD  Assessment:  Primary concerns today: Weight management.  Stephanie Benson was referred by Lennart Pall, PhD.  She lost weight with [place] two years ago before her son's wedding; got down to 142 lb, then regained weight slowly (162 lb today).  She fractured her olecranon and tore tendons in her L arm in early 2021, requiring surgery and much rehab, which limited her exercise for a while.  Stephanie Benson does not like to plan or cook meals.  She often feels a lack of control with respect to eating in the evenings, especially sweets.  Listened to the podcast Weight Loss for Busy Physicians by Tessa Lerner, which helped her realize there is more to weight management than just willpower.     Learning Readiness: Ready  Usual eating pattern: 2-3 meals and 2-4 snacks per day. Frequent foods and beverages: sparkling water, wine (1-2 glass/day), coffee with ~3 tbsp cream; scrmbled eggs, toast, plain 2% Grk yogurt, walnuts, fruit, Clif Bars, salad, .  Gets takeout a couple times a week.   Avoided foods: mushrooms, anchovies, shellfish..   Usual physical activity: Walks the dog 30-60 min 2 X day, personal trainer 30 min 2 X wk.   Sleep: Estimates 4-7 hrs sleep per night.  Falls asleep reasonably well, but wakes at 2 or 3, and has trouble falling back to sleep.   24-hr recall: (Up at 7:30 AM; 1 c coffee with 3 tbsp cream) B (9 AM)-   2 scrmbled eggs, 1/2 toast, 1/2 tsp butter, water Snk ( AM)-   --- L (12 PM)-  1/2 pc spanikopita, 2 tsp tziki, water Snk (1:15)-  4-6 Ritz crackers, 1-2 oz Havarti cheese  D (6 PM)-  1/2 c Okra, 1 c steamed yellow squash, 2 glasses wine Snk ( PM)-  --- Typical day? No.  Usually has evening snack such as Oreos or other sweets like M&Ms or ice cream, and normally has protein with dinner.    Nutritional Diagnosis:  NB-1.1 Food and  nutrition-related knowledge deficit As related to weight management.  As evidenced by frustration with apparent inability to manage evening snacking; unclear as to the drivers of these food choices.  Handouts given during visit include:  After-Visit Summary (AVS)  Goals Sheet  Meal Planning Form  Demonstrated degree of understanding via:  Teach Back  Barriers to learning/adherence to lifestyle change: Lack of understanding of what drives eating behaviors.  Monitoring/Evaluation:  Dietary intake, exercise, and body weight in 3 week(s).

## 2020-08-04 NOTE — Patient Instructions (Addendum)
Goals:  1. Include a source of protein at each meal such as beans, soy products, meat, fish, poultry, dairy, eggs.  If you are having beans, consider a FULL cup to be a protein portion (no less).   - Your meals should pass the "guest test":  Would you serve this to a guest in your home, and call it a meal?  2. At both lunch and dinner, obtain twice the volume of vegetables as either protein or carb foods.    3. Set aside a few minutes once a week to plan the week's meals.     (Also plan at least 7 meals that conform to a "REAL MEAL" definition (includes protein, carb, and veg's), using the Meal Planning Form.)  Document progress on the above goals using the Goals Sheet provided today.    An important concept to acknowledge is that TASTE PREFERENCES ARE LEARNED.  This means it will get easier to choose foods you know are good for you if you are exposed to them enough.     The concept of intermittent fasting in general is sound - if practiced with some moderation.  It is even endorsed by the AMA - in moderation.  The main benefit is in shrinking the window-of-eating time in a 24-hr period.    Follow-up: In-office appt Monday, Nov 8 at 11 AM.

## 2020-08-05 DIAGNOSIS — F4323 Adjustment disorder with mixed anxiety and depressed mood: Secondary | ICD-10-CM | POA: Diagnosis not present

## 2020-08-07 ENCOUNTER — Other Ambulatory Visit: Payer: Self-pay | Admitting: Geriatric Medicine

## 2020-08-07 DIAGNOSIS — R928 Other abnormal and inconclusive findings on diagnostic imaging of breast: Secondary | ICD-10-CM

## 2020-08-08 ENCOUNTER — Ambulatory Visit
Admission: RE | Admit: 2020-08-08 | Discharge: 2020-08-08 | Disposition: A | Discharge status: Home / Self Care | Payer: 59 | Source: Ambulatory Visit | Attending: Geriatric Medicine | Admitting: Geriatric Medicine

## 2020-08-08 ENCOUNTER — Ambulatory Visit: Payer: 59

## 2020-08-08 ENCOUNTER — Other Ambulatory Visit: Payer: Self-pay

## 2020-08-08 DIAGNOSIS — R928 Other abnormal and inconclusive findings on diagnostic imaging of breast: Secondary | ICD-10-CM

## 2020-08-08 DIAGNOSIS — R922 Inconclusive mammogram: Secondary | ICD-10-CM | POA: Diagnosis not present

## 2020-08-20 DIAGNOSIS — F4322 Adjustment disorder with anxiety: Secondary | ICD-10-CM | POA: Diagnosis not present

## 2020-08-22 ENCOUNTER — Other Ambulatory Visit: Payer: 59

## 2020-08-25 ENCOUNTER — Other Ambulatory Visit: Payer: Self-pay

## 2020-08-25 ENCOUNTER — Encounter: Payer: Self-pay | Admitting: Family Medicine

## 2020-08-25 ENCOUNTER — Ambulatory Visit (INDEPENDENT_AMBULATORY_CARE_PROVIDER_SITE_OTHER): Payer: 59 | Admitting: Family Medicine

## 2020-08-25 DIAGNOSIS — Z713 Dietary counseling and surveillance: Secondary | ICD-10-CM

## 2020-08-25 NOTE — Patient Instructions (Addendum)
Our appetite mechanism includes our brain, our gut, and our mouth.  This means that CHEWING is an important component, making solid foods usually more satisfying.    Goal: Include a source of protein at each meal, in addition to lots of vegetables and a moderate amount of carbohydrate.  Meals should pass the "guest test."   Document progress on the above goals using the Goals Sheet provided today.    - Complete Meal Planning Form.   - Take the quiz at VCFever.ch.  Read the Four Tendencies.   - Thanksgiving:   - Include a lot of vegetables.   - DO have the food(s) you love and that are traditional.   - DO make sure you get a walk in, especially after the meal, if you want to keep blood sugar lower.   Follow-up: In-office appt Monday, Nov 29 at 2:30 PM.

## 2020-08-25 NOTE — Progress Notes (Signed)
Medical Nutrition Therapy Appt start time: 1100 end time: 1200 (1 hour) Patient has completed 2nd dose of COVID-19 vaccine March 2021. Therapist Lennart Pall, PhD  Relevant history/background:  Stephanie Benson was referred by Lennart Pall, PhD.  She lost weight with [place] two years ago before her son's wedding; got down to 142 lb, then regained 20 lb.  She fractured her olecranon and tore tendons in her L arm in early 2021, requiring surgery and much rehab, which limited exercise for a while.  Stephanie Benson does not like to plan or cook meals.  She often feels a lack of control with respect to eating in the evenings, especially sweets.  Listened to the podcast Weight Loss for Busy Physicians by Tessa Lerner, which helped her realize there is more to weight management than just willpower.     Assessment:  Primary concerns today: Weight management.  [name] reviewed the pdf file on behavior change emailed to her, and started to complete the Meal Planning Form provided, but is resistant to planning, not wanting to feel "boxed in," but rather to feel able to cook what she wants at the moment.  She has not been tracking progress on behavioral goals using the Goals Sheet, stymied by indecisiveness on how to use it.  We talked today about the need for meals to be satisfying, and also that she can still use her meal planning form to advantage "in the moment" by just making sure she has ingredients on hand to make any of the 7 meal options.    Recent eating pattern: 2-3 meals and 2-3 snacks per day. Recent physical activity: Same - walks the dog 30-60 min 2 X day, personal trainer 30 min 2 X wk.   Sleep: Estimates 4-7 hrs sleep per night.  Often wakes at 2 or 3 AM, and has trouble falling back to sleep.   Wt today: Stable, 162.8 lb.  24-hr recall:  (Up at 7 AM) B (8 AM)-  2 c coffee, each w/ 1/2 tsp flavored creamer, 5 oz vanilla Grk yogurt, 1 tbsp walnuts, 1/4 c blueberries Snk ( AM)-  --- L (12:30 PM)-  Grn Val Grill: 1/2  quiche, 1/2 arugula salad, 1 c cappucino Snk (4 PM)-  5-6 Triscuits D (6 PM)-  1 c beef and barley soup, tortilla chips, 1 glass red wine, 1/3 slice cake Snk ( PM)-  --- Typical day? No.  Other days afternoon snacks includes pimiento chs or might be a sweet snack or fruit.  Lunch is often leftover dinner foods.  Breakfast is often one or two eggs with 1 slc toast +/- cheese.    Nutritional Diagnosis: No tangible progress on identified nutr dx, but remains engaged in what is likely to be a long process of discovery: NB-1.1 Food and nutrition-related knowledge deficit As related to weight management.  As evidenced by frustration with apparent inability to manage evening snacking; unclear as to the drivers of these food choices.  Handouts given during visit include:  After-Visit Summary (AVS)  Monitoring/Evaluation:  Dietary intake, exercise, and body weight in 3 week(s).

## 2020-08-30 MED FILL — ESCITALOPRAM 10 MG TABLET: 10 | 90 days supply | Qty: 90 | Fill #1

## 2020-09-03 DIAGNOSIS — F4323 Adjustment disorder with mixed anxiety and depressed mood: Secondary | ICD-10-CM | POA: Diagnosis not present

## 2020-09-15 ENCOUNTER — Ambulatory Visit: Payer: 59 | Admitting: Family Medicine

## 2020-09-15 DIAGNOSIS — F4323 Adjustment disorder with mixed anxiety and depressed mood: Secondary | ICD-10-CM | POA: Diagnosis not present

## 2020-09-23 ENCOUNTER — Ambulatory Visit (INDEPENDENT_AMBULATORY_CARE_PROVIDER_SITE_OTHER): Payer: 59 | Admitting: Family Medicine

## 2020-09-23 ENCOUNTER — Other Ambulatory Visit: Payer: Self-pay

## 2020-09-23 DIAGNOSIS — Z713 Dietary counseling and surveillance: Secondary | ICD-10-CM

## 2020-09-23 NOTE — Progress Notes (Signed)
Medical Nutrition Therapy Appt start time: 1430 end time: 2671 (1 hour) Patient has completed 2nd dose of COVID-19 vaccine March 2021. Therapist Lennart Pall, PhD  Relevant history/background:  Stephanie Benson was referred by Lennart Pall, PhD.  She lost weight with [employer] two years ago before her son's wedding; got down to 142 lb, then regained 20 lb.  She fractured her olecranon and tore tendons in her L arm in early 2021, requiring surgery and much rehab, which limited exercise for a while.  Stephanie Benson does not like to plan or cook meals.  She often feels a lack of control with respect to eating in the evenings, especially sweets.  Listened to the podcast Weight Loss for Busy Physicians by Tessa Lerner, which helped her realize there is more to weight management than just willpower.     Assessment:  Primary concerns today: Weight management.  Stephanie Benson is frustrated that she can't motivate herself to make recommended dietary changes.  She is clearly troubled by her current weight, and is bored with vegetables and other "healthy" foods.  She did not complete the Meal Planning Form, which she seems to have consider another part of the effort to make her diet healthier.  Nowhere in her thinking was the idea that the form is to help her make more delicious or easier meals.  We talked at length today about (1) the value of food satisfaction to a healthy (sustainable) diet as well as to sense of appetite control; (2) our evolving bodies and body image as we age; and (3) the importance of obtaining balanced ("real") meals (which can also be delicious) throughout the day.  Progress on recommendations:  - 3 real meals/day: Struggling; many meals do not meet the guest test criteria.  - Document meals/day: Not done.   - Th'giving (veg's; satisfxn; walk): Went "ok"; not enthusiastic.   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    Recent eating pattern: 2-3 meals and 2-3 snacks per  day. Recent physical activity: Walks the dog 30-60 min 2 X day, personal trainer 30 min 2 X wk, Pel[place] 30-45 min 3 X wk.    Sleep: Poor; 4-7 hrs/night.    Wt today: No weight check today.  No food recall today.   Nutritional Diagnosis: No tangible progress on identified nutr dx, but remains engaged in what is likely to be a long process of discovery: NB-1.1 Food and nutrition-related knowledge deficit As related to weight management.  As evidenced by frustration with apparent inability to manage evening snacking; unclear as to the drivers of these food choices.  Handouts given during visit include:  After-Visit Summary (AVS)  Monitoring/Evaluation:  Dietary intake, exercise, and body weight in 4 week(s).

## 2020-09-23 NOTE — Patient Instructions (Addendum)
Your nutritionist's primary objective is to have you nourished well.  A secondary objective is for you to start healing your relationship with food and to feel in control of your appetite.  Consistency is is helpful with respect to both food choices and exercise.    - Remember: We are designed to get pleasure from food.  If what you eat is not satisfying, it will not be sustainable.    - When you get a chance, re-listen to the Four Tendencies chapter, and take notes on the [place].  What resonates with you, and what can you apply to yourself?  (Another book recommendation - Webb Silversmith Lamott's latest, Dusk, Night, Dawn.)  - When you work on [employer] Form, the goal is to come up with meals that are relatively quick and easy to prepare and are meals you love.  What's good for you is another consideration, but not the one you start with.    - I'd like for you to continue aiming for 3 real meals per day.    Follow-up in-office appt on Tuesday, Jan 4 at 2:30 PM.

## 2020-10-01 DIAGNOSIS — F4323 Adjustment disorder with mixed anxiety and depressed mood: Secondary | ICD-10-CM | POA: Diagnosis not present

## 2020-10-21 ENCOUNTER — Other Ambulatory Visit: Payer: Self-pay

## 2020-10-21 ENCOUNTER — Ambulatory Visit (INDEPENDENT_AMBULATORY_CARE_PROVIDER_SITE_OTHER): Payer: 59 | Admitting: Family Medicine

## 2020-10-21 DIAGNOSIS — Z713 Dietary counseling and surveillance: Secondary | ICD-10-CM | POA: Diagnosis not present

## 2020-10-21 NOTE — Patient Instructions (Addendum)
Goals: 1. Re-listen to the Four Tendencies chapter on the Obliger.  Take notes.  (Another book recommendation - Thurston Hole Lamott's latest, Dusk, Night, Dawn.)  2. When you work on [employer] Form, the goal is to come up with meals that are relatively quick and easy to prepare and are meals you love.  What's good for you is another consideration, but not the one you start with.    3. Continue aiming for 3 real meals per day, and document meals/day.  Also:  - Register for the class Living Well: Moving Good Intentions to Real Behavior Change at [employer] that starts Jan 11 at 5:30 PM.    - Consider using a Viactiv Chew after dinner or before bed each day.    - When we don't practice self-kindness, we are likely to practice self-indulgence.   Follow-up video appt on Tuesday, Feb 1 at 2:30 PM.

## 2020-10-21 NOTE — Progress Notes (Signed)
Telehealth Encounter (TEXT link to [phone] ) Patient has completed 2nd dose of COVID-19 vaccine March 2021. Therapist Madelaine Etienne, PhD  I connected with Stephanie Benson (MRN [id number]) on 10/21/2020 by MyChart video-enabled, HIPAA-compliant telemedicine application, verified that I was speaking with the correct person using two identifiers, and that the patient was in a private environment conducive to confidentiality.  The patient agreed to proceed.  Persons participating in visit were patient, provider (registered dietitian) Linna Darner, PhD, RD, LDN, CEDRD, and observer Retta Diones, MD.   Provider was located at [facility] during this telehealth encounter; patient was at home.  Appt start time: 1430 end time: 1530 (1 hour)  Reason for telehealth visit: Stephanie Benson was referred by Madelaine Etienne, PhD for Medical Nutrition Therapy related to weight management.  Relevant history/background:  Stephanie Benson lost weight with [employer] two years ago before her son's wedding, down to 142 lb, before regaining 20 lb.  She fractured her olecranon and tore tendons in her L arm in early 2021, requiring surgery and rehab, which limited exercise for a while.  Stephanie Benson does not like to plan or cook meals.  She often feels a lack of control with respect to eating in the evenings, especially sweets.  Listened to the podcast Weight Loss for Busy Physicians by Virl Cagey, which helped her realize there is more to weight management than just willpower.      Assessment: Stephanie Benson was discouraged to have to buy pants in a larger size recently.  Also still frustrated at frequently eating in nighttime.  She has been "mildly better" at meal planning.  Has been eating more home-prepared foods (i.e., soup) vs. takeout.  Feels like she has started to establish a slightly better attitude re. making recommended changes.  Plans to take the Behavior Change class offered by [facility], which starts 10/28/20.    Recent  eating pattern: Same: 2-3 meals and 2-3 snacks per day. Recent physical activity: Same: Walks the dog 30-60 min 2 X day, personal trainer 30 min 2 X wk, Pelleton 30-45 min 3 X wk.    Sleep: Poor; 4-7 hrs/night.  Eating in the night ~4 X wk.    Progress on recommendations:  - 3 real meals/day: Still struggling; many meals do not meet the guest test criteria.  - Document meals/day: Not done.   - Re-listen to Four Tendencies chapter on Obliger.  Has not done [employer] book unavailable).   No food recall today.   Intervention: Reviewed diet and exercise history, and discussed night-time eating habit, briefly revisiting last appt's conversation about (1) the value of food satisfaction to a healthy (sustainable) diet as well as to sense of appetite control; (2) our evolving bodies and body image as we age; and (3) the importance of obtaining balanced ("real") meals (which can also be delicious) throughout the day.   For recommendations and goals, see Patient Instructions.    Follow-up: 4 weeks.  Stephanie Benson,Stephanie Benson

## 2020-10-22 DIAGNOSIS — F4323 Adjustment disorder with mixed anxiety and depressed mood: Secondary | ICD-10-CM | POA: Diagnosis not present

## 2020-11-05 DIAGNOSIS — F4323 Adjustment disorder with mixed anxiety and depressed mood: Secondary | ICD-10-CM | POA: Diagnosis not present

## 2020-11-11 ENCOUNTER — Telehealth: Payer: 59 | Admitting: Family Medicine

## 2020-11-18 ENCOUNTER — Other Ambulatory Visit: Payer: Self-pay

## 2020-11-18 ENCOUNTER — Telehealth: Payer: 59 | Admitting: Family Medicine

## 2020-11-18 DIAGNOSIS — F4323 Adjustment disorder with mixed anxiety and depressed mood: Secondary | ICD-10-CM | POA: Diagnosis not present

## 2020-11-19 DIAGNOSIS — F4322 Adjustment disorder with anxiety: Secondary | ICD-10-CM | POA: Diagnosis not present

## 2020-11-24 NOTE — Progress Notes (Signed)
Telehealth Encounter (TEXT link to [phone] ) Patient has completed 2nd dose of COVID-19 vaccine March 2021. Therapist Lennart Pall, PhD  I connected with Stephanie Benson (MRN [id number]) on 11/24/2020 by MyChart video-enabled, HIPAA-compliant telemedicine application, verified that I was speaking with the correct person using two identifiers, and that the patient was in a private environment conducive to confidentiality.  The patient agreed to proceed.  Persons participating in visit were patient and provider (registered dietitian) Kennith Center, PhD, RD, LDN, CEDRD.  Provider was located at [place] during this telehealth encounter; patient was at home.  Appt start time: 0900 end time: 1000 (1 hour)  Reason for telehealth visit: Stephanie Benson was referred by Lennart Pall, PhD for Medical Nutrition Therapy related to weight management.  Relevant history/background:  Stephanie Benson lost weight with [employer] two years ago before her son's wedding, down to 142 lb, before regaining 20 lb.  She fractured her olecranon and tore tendons in her L arm in early 2021, requiring surgery and rehab, which limited exercise for a while.  Stephanie Benson does not like to plan or cook meals.  She often feels a lack of control with respect to eating in the evenings, especially sweets.  Listened to the podcast Weight Loss for Busy Physicians by Tessa Lerner, which helped her realize there is more to weight management than just willpower.      Assessment: Stephanie Benson said she feels overwhelmed with all she has going on, including 2 meetings a week for Adult Children of Alcoholics (since last March) and some family health concerns.  She has been distracted from her own efforts at making better food choices for this reason.   Recent eating pattern: 2 meals and 2-3 snacks per day. Night snacking: better; ~2 X wk.  Recent physical activity: Same: Walks the dog 30-60 min 2 X day, personal trainer 30 min 2 X wk, Pelleton 30-45  min 3 X wk.   Sleep: A bit improved; 7-8 hrs/night (not sure why).   Progress on recommendations:  - Meal Planning Form:   Has not yet used.  - 3 real meals/day:   Usually getting only 2 meals/day.   - Document meals/day:   Not done.   - Four Tendencies Obliger chapter:   Has not done [employer] book unavailable).   No food recall.   Intervention: Reviewed diet and exercise history, and confirmed goals.   For recommendations and goals, see Patient Instructions.    Follow-up: 3 weeks.  Stephanie Benson,Stephanie Benson

## 2020-11-25 ENCOUNTER — Ambulatory Visit (INDEPENDENT_AMBULATORY_CARE_PROVIDER_SITE_OTHER): Payer: 59 | Admitting: Family Medicine

## 2020-11-25 ENCOUNTER — Other Ambulatory Visit: Payer: Self-pay

## 2020-11-25 DIAGNOSIS — Z713 Dietary counseling and surveillance: Secondary | ICD-10-CM

## 2020-11-25 NOTE — Patient Instructions (Signed)
  Specific Goals: 1. Using your Meal Planning Form, design at least 5 meals you enjoy that are relatively quick and easy to prepare.   3. Have 3 real meals per day, and document the number of meals you have each day.   Keep in mind our conversations about the value of satisfaction from foods/meals in helping to manage appetite.    An additional 2-part "assignment": 1. Listen to the [employer] podcast Minimizing Pain Maximizing Joy episode: [email or web address] 2. Re-listen to the Four Tendencies chapter on the Obliger. Take notes.   Follow-up video appt on Tuesday, March 1 at 9 AM.

## 2020-12-03 DIAGNOSIS — F4323 Adjustment disorder with mixed anxiety and depressed mood: Secondary | ICD-10-CM | POA: Diagnosis not present

## 2020-12-15 NOTE — Progress Notes (Signed)
Telehealth Encounter (TEXT link to [phone] ) Patient has completed 2nd dose of COVID-19 vaccine March 2021. Therapist Lennart Pall, PhD  I connected with Stephanie Benson (MRN [id number]) on 12/15/2020 by MyChart video-enabled, HIPAA-compliant telemedicine application, verified that I was speaking with the correct person using two identifiers, and that the patient was in a private environment conducive to confidentiality.  The patient agreed to proceed.  Persons participating in visit were patient and provider (registered dietitian) Kennith Center, PhD, RD, LDN, CEDRD.  Provider was located at [place] during this telehealth encounter; patient was at home.  Appt start time: 0900 end time: 1000 (1 hour)  Reason for telehealth visit: Stephanie Benson was referred by Lennart Pall, PhD for Medical Nutrition Therapy related to weight management.  Relevant history/background:  Stephanie Benson lost weight with [employer] two years ago before her son's wedding, down to 142 lb, before regaining 20 lb.  She fractured her olecranon and tore tendons in her L arm in early 2021, requiring surgery and rehab, which limited exercise for a while.  Stephanie Benson does not like to plan or cook meals.  She often feels a lack of control with respect to eating in the evenings, especially sweets.  Listened to the podcast Weight Loss for Busy Physicians by Tessa Lerner, which helped her realize there is more to weight management than just willpower.      Assessment: Stephanie Benson has recommitted herself to making better food choices.  She has started eating more fruit and vegetables, and has had several days of no wine.  Protein intake is still marginal, and she is still not documenting behaviors, but we discussed using the Goals Sheet to do so.   Recent eating pattern: 2 meals and 2-3 snacks per day. Night snacking: ~2 X wk.  Recent physical activity: Same: Walks the dog 30-60 min 2 X day, personal trainer 30 min 2 X wk, Pelleton  30-45 min 3 X wk.   Sleep: 7-8 hrs/night.   Progress on recommendations:  - Meal Planning Form:   Has not yet used.  - 3 real meals/day:   Usually getting 2-3 meals/day.   - Document meals/day:   Not done.   - Four Tendencies Obliger chapter:   Has re-read sections, and taken notes.  No food recall today.   Intervention: Reviewed diet and exercise history, and re-established goals, and clarified value of as well as ways of documenting progress.   For recommendations and goals, see Patient Instructions.    Follow-up: 7 weeks.  Stephanie Benson,Stephanie Benson

## 2020-12-16 ENCOUNTER — Other Ambulatory Visit: Payer: Self-pay

## 2020-12-16 ENCOUNTER — Ambulatory Visit (INDEPENDENT_AMBULATORY_CARE_PROVIDER_SITE_OTHER): Payer: 59 | Admitting: Family Medicine

## 2020-12-16 DIAGNOSIS — Z713 Dietary counseling and surveillance: Secondary | ICD-10-CM | POA: Diagnosis not present

## 2020-12-16 NOTE — Patient Instructions (Addendum)
Rather than focusing on the pounds on the scale, focusing on your intended behaviors is what will most help you achieve your desired outcome.    Design at least 5 meals you enjoy that are relatively quick and easy to prepare.   Daily Goals: 1. Include a source of protein at each meal. 2. At both lunch and dinner, obtain twice the volume of vegetables as either protein or carb foods. 3. Set aside a few minutes once a week to plan the week's meals.    Document progress on your goals each day using your Goals Sheet.  A quick glance at your Goals Sheet will tell you where you are doing great as well as areas that need more work.    Reminder: Listen to the [employer] podcast Minimizing Pain Maximizing Joy episode: [employer]  Email a progress report the third or fourth week in March.    Follow-up video appt on Tuesday, April 19 at 9 AM.

## 2020-12-17 DIAGNOSIS — F4323 Adjustment disorder with mixed anxiety and depressed mood: Secondary | ICD-10-CM | POA: Diagnosis not present

## 2020-12-31 DIAGNOSIS — F4323 Adjustment disorder with mixed anxiety and depressed mood: Secondary | ICD-10-CM | POA: Diagnosis not present

## 2021-01-14 DIAGNOSIS — F4323 Adjustment disorder with mixed anxiety and depressed mood: Secondary | ICD-10-CM | POA: Diagnosis not present

## 2021-01-28 DIAGNOSIS — F4323 Adjustment disorder with mixed anxiety and depressed mood: Secondary | ICD-10-CM | POA: Diagnosis not present

## 2021-02-02 NOTE — Progress Notes (Signed)
Telehealth Encounter (TEXT link to [phone] ) Patient has completed 2nd dose of COVID-19 vaccine March 2021. Therapist Lennart Pall, PhD  I connected with Stephanie Benson (MRN [id number]) on 02/03/2021 by MyChart video-enabled, HIPAA-compliant telemedicine application, verified that I was speaking with the correct person using two identifiers, and that the patient was in a private environment conducive to confidentiality.  The patient agreed to proceed.  Persons participating in visit were patient and provider (registered dietitian) Kennith Center, PhD, RD, LDN, CEDRD.  Provider was located at [place] during this telehealth encounter; patient was at home.  Appt start time: 0900 end time: 1000 (1 hour)  Reason for telehealth visit: Stephanie Benson was referred by Lennart Pall, PhD for Medical Nutrition Therapy related to weight management.  Relevant history/background:  Stephanie Benson lost weight with [employer] two years ago before her son's wedding, down to 142 lb, before regaining 20 lb.  She fractured her olecranon and tore tendons in her L arm in early 2021, requiring surgery and rehab, which limited exercise for a while.  Stephanie Benson does not like to plan or cook meals.  She often feels a lack of control with respect to eating in the evenings, especially sweets.  Listened to the podcast Weight Loss for Busy Physicians by Tessa Lerner, which helped her realize there is more to weight management than just willpower.      Assessment: Stephanie Benson almost cancelled today's appt, saying she felt like she is wasting my time, as she's not followed through on her goals.  She is frustrated that she has lost only about 3 lb in the past few months.  Still is mostly uni-focused on weight.   [name] remains resistant to planning, and she is not sure why.  She found the idea of  planning meals for her sister more appealing, and said she will try that.   Recent eating pattern: 3 meals and 2-3 snacks per day (although  bkfst = a Clif Bar). Night snacking: ~4 X wk; tends to be >10 PM; usually sweets/chocolate.  Recent physical activity: Walks the dog 30 min 2 X day, personal trainer strength training 30 min 2 X wk, Pelleton 30-45 min 2 X wk.  Sleep: 7-8 hrs/night.  Predictably hungry: Upon waking, ~6 AM; also wants to eat sweets in evening.   Progress on recommendations:  - Meal Planning Form:   Has not yet used.  - 3 real meals/day:   Usually 2-3 meals/day.   - Document meals/day:   Recorded all food intake for 1 week.   24-hr recall:  (Up at 7 AM) B (8 AM)-  2 c coffee, 2 tbsp cream, Clif Bar (in car) 260 kcal (10 g pro, 21 g sugar) Snk ( AM)-  --- L (2 PM)-  3 slc pepperoni pizza, seltzer Snk ( PM)-  8-10 dark choc's (impulse buy at [employer]) D ( PM)-  10-12 chips with taco beef & cheese, 1 glass wine, water Snk (10 PM)-  12 choc-covered pretzels Typical day? No.  Rarely eats pizza for lunch; usually has veg's with dinner; has choc usually 1-2 X day, but less than yesterday.    Intervention: Reviewed diet and exercise history, and re-established goals.   For recommendations and goals, see Patient Instructions.    Follow-up: 8 weeks.  (Will be very busy in May, and traveling in early June.)   [facility]

## 2021-02-03 ENCOUNTER — Ambulatory Visit (INDEPENDENT_AMBULATORY_CARE_PROVIDER_SITE_OTHER): Payer: 59 | Admitting: Family Medicine

## 2021-02-03 ENCOUNTER — Other Ambulatory Visit: Payer: Self-pay

## 2021-02-03 DIAGNOSIS — Z713 Dietary counseling and surveillance: Secondary | ICD-10-CM

## 2021-02-03 NOTE — Patient Instructions (Addendum)
Shifting your focus from weight to self-care will help you direct attention to the behaviors you are trying to practice.  This should help you avoid the "downward negative spiral" that's too often associated with subsequent regretted food choices and/or with giving up efforts altogether.    Guidelines:  - Whenever you have pizza, always have a side of veg or fruit.   - Be selective about foods/sweets you have in the house.  Buy small packages of any sweets you buy.  When you eat sweets, give yourself full permission, and pay attention, savoring every bite.   - Our appetite mechanism consists of our brain, our mouth (chewing), and our gut.  - Solid foods tend to be more satisfying than liquid or semi-liquid foods.   - When you have made a regretted food choice, you feel bad.  When you feel bad, it's much harder to function at a high (or normal) cognitive level, so your subsequent choices are likely to not be so great.  Furthermore, when your cognition is impaired (b/c you feel bad), you are less able to figure out a way to prevent this same choice in the future.  Daily Goals: 1. Include a source of protein at each meal. 2. Obtain twice the volume of vegetables vs. protein or carb foods for at least 10 lunch/dinner meals/week. Document progress on the above goals using just the Goals Sheet provided.    One-time goal:  Design at least 77meals for your sisterthat taste great, and are relatively quick and easy to prepare.  Email your list of meals to [facility] for feedback no later than April 27, and send a progress report on each of the following weeks:  May 8 and May 22.    Follow-up video appt on Thursday, May 16 at 3:30 PM.

## 2021-02-11 DIAGNOSIS — F4323 Adjustment disorder with mixed anxiety and depressed mood: Secondary | ICD-10-CM | POA: Diagnosis not present

## 2021-02-25 ENCOUNTER — Other Ambulatory Visit (HOSPITAL_COMMUNITY): Payer: Self-pay

## 2021-02-25 DIAGNOSIS — F4323 Adjustment disorder with mixed anxiety and depressed mood: Secondary | ICD-10-CM | POA: Diagnosis not present

## 2021-02-25 MED ORDER — ESCITALOPRAM OXALATE 10 MG PO TABS
10.0000 mg | ORAL_TABLET | Freq: Every day | ORAL | 1 refills | Status: DC
  Filled 2021-02-25: qty 90, 90d supply, fill #0
  Filled 2021-09-21: qty 90, 90d supply, fill #1

## 2021-02-26 ENCOUNTER — Other Ambulatory Visit (HOSPITAL_COMMUNITY): Payer: Self-pay

## 2021-02-27 ENCOUNTER — Other Ambulatory Visit (HOSPITAL_COMMUNITY): Payer: Self-pay

## 2021-03-11 DIAGNOSIS — F4323 Adjustment disorder with mixed anxiety and depressed mood: Secondary | ICD-10-CM | POA: Diagnosis not present

## 2021-04-01 DIAGNOSIS — F4323 Adjustment disorder with mixed anxiety and depressed mood: Secondary | ICD-10-CM | POA: Diagnosis not present

## 2021-04-02 ENCOUNTER — Telehealth: Payer: 59 | Admitting: Family Medicine

## 2021-04-02 ENCOUNTER — Other Ambulatory Visit: Payer: Self-pay

## 2021-04-15 DIAGNOSIS — F4323 Adjustment disorder with mixed anxiety and depressed mood: Secondary | ICD-10-CM | POA: Diagnosis not present

## 2021-04-29 DIAGNOSIS — F4323 Adjustment disorder with mixed anxiety and depressed mood: Secondary | ICD-10-CM | POA: Diagnosis not present

## 2021-05-12 DIAGNOSIS — Z85828 Personal history of other malignant neoplasm of skin: Secondary | ICD-10-CM | POA: Diagnosis not present

## 2021-05-12 DIAGNOSIS — L570 Actinic keratosis: Secondary | ICD-10-CM | POA: Diagnosis not present

## 2021-05-12 DIAGNOSIS — D2362 Other benign neoplasm of skin of left upper limb, including shoulder: Secondary | ICD-10-CM | POA: Diagnosis not present

## 2021-05-12 DIAGNOSIS — L821 Other seborrheic keratosis: Secondary | ICD-10-CM | POA: Diagnosis not present

## 2021-05-12 DIAGNOSIS — Z85820 Personal history of malignant melanoma of skin: Secondary | ICD-10-CM | POA: Diagnosis not present

## 2021-05-13 DIAGNOSIS — F4323 Adjustment disorder with mixed anxiety and depressed mood: Secondary | ICD-10-CM | POA: Diagnosis not present

## 2021-05-27 DIAGNOSIS — F4323 Adjustment disorder with mixed anxiety and depressed mood: Secondary | ICD-10-CM | POA: Diagnosis not present

## 2021-06-10 DIAGNOSIS — F4323 Adjustment disorder with mixed anxiety and depressed mood: Secondary | ICD-10-CM | POA: Diagnosis not present

## 2021-06-24 DIAGNOSIS — F4323 Adjustment disorder with mixed anxiety and depressed mood: Secondary | ICD-10-CM | POA: Diagnosis not present

## 2021-07-01 DIAGNOSIS — F4323 Adjustment disorder with mixed anxiety and depressed mood: Secondary | ICD-10-CM | POA: Diagnosis not present

## 2021-07-08 DIAGNOSIS — F4323 Adjustment disorder with mixed anxiety and depressed mood: Secondary | ICD-10-CM | POA: Diagnosis not present

## 2021-07-21 ENCOUNTER — Other Ambulatory Visit: Payer: Self-pay | Admitting: Geriatric Medicine

## 2021-07-21 DIAGNOSIS — Z1231 Encounter for screening mammogram for malignant neoplasm of breast: Secondary | ICD-10-CM

## 2021-07-22 DIAGNOSIS — F4323 Adjustment disorder with mixed anxiety and depressed mood: Secondary | ICD-10-CM | POA: Diagnosis not present

## 2021-08-04 ENCOUNTER — Ambulatory Visit (HOSPITAL_BASED_OUTPATIENT_CLINIC_OR_DEPARTMENT_OTHER): Payer: 59 | Admitting: Obstetrics & Gynecology

## 2021-08-05 DIAGNOSIS — F4323 Adjustment disorder with mixed anxiety and depressed mood: Secondary | ICD-10-CM | POA: Diagnosis not present

## 2021-08-31 ENCOUNTER — Ambulatory Visit: Payer: 59

## 2021-09-02 ENCOUNTER — Ambulatory Visit (HOSPITAL_BASED_OUTPATIENT_CLINIC_OR_DEPARTMENT_OTHER): Payer: 59 | Admitting: Obstetrics & Gynecology

## 2021-09-02 DIAGNOSIS — F4323 Adjustment disorder with mixed anxiety and depressed mood: Secondary | ICD-10-CM | POA: Diagnosis not present

## 2021-09-16 DIAGNOSIS — F4323 Adjustment disorder with mixed anxiety and depressed mood: Secondary | ICD-10-CM | POA: Diagnosis not present

## 2021-09-21 ENCOUNTER — Other Ambulatory Visit: Payer: Self-pay

## 2021-09-21 ENCOUNTER — Ambulatory Visit (INDEPENDENT_AMBULATORY_CARE_PROVIDER_SITE_OTHER): Payer: 59 | Admitting: Obstetrics & Gynecology

## 2021-09-21 ENCOUNTER — Encounter (HOSPITAL_BASED_OUTPATIENT_CLINIC_OR_DEPARTMENT_OTHER): Payer: Self-pay | Admitting: Obstetrics & Gynecology

## 2021-09-21 ENCOUNTER — Other Ambulatory Visit (HOSPITAL_COMMUNITY)
Admission: RE | Admit: 2021-09-21 | Discharge: 2021-09-21 | Disposition: A | Discharge status: Home / Self Care | Payer: 59 | Source: Ambulatory Visit | Attending: Obstetrics & Gynecology | Admitting: Obstetrics & Gynecology

## 2021-09-21 ENCOUNTER — Other Ambulatory Visit (HOSPITAL_COMMUNITY): Payer: Self-pay

## 2021-09-21 VITALS — BP 130/83 | HR 85 | Ht 67.0 in | Wt 163.4 lb

## 2021-09-21 DIAGNOSIS — E7800 Pure hypercholesterolemia, unspecified: Secondary | ICD-10-CM | POA: Diagnosis not present

## 2021-09-21 DIAGNOSIS — F419 Anxiety disorder, unspecified: Secondary | ICD-10-CM | POA: Diagnosis not present

## 2021-09-21 DIAGNOSIS — Z01419 Encounter for gynecological examination (general) (routine) without abnormal findings: Secondary | ICD-10-CM | POA: Diagnosis not present

## 2021-09-21 DIAGNOSIS — Z124 Encounter for screening for malignant neoplasm of cervix: Secondary | ICD-10-CM

## 2021-09-21 DIAGNOSIS — Z780 Asymptomatic menopausal state: Secondary | ICD-10-CM | POA: Diagnosis not present

## 2021-09-21 DIAGNOSIS — E2839 Other primary ovarian failure: Secondary | ICD-10-CM | POA: Diagnosis not present

## 2021-09-21 DIAGNOSIS — I493 Ventricular premature depolarization: Secondary | ICD-10-CM | POA: Insufficient documentation

## 2021-09-21 NOTE — Progress Notes (Signed)
60 y.o. [id number] Married White or Caucasian female here for annual exam.  Doing well.  Denies vaginal bleeding.    Reports her cholesterol has been elevated for years.  Last cholesterol done with Dr. Felipa Eth, 06/2020, at [facility] showed total cholesterol of 258 and LDLs were 157 and HDLs were 84.  Cholesterol has been elevated for at least 10 years.  Wants to know if needs any other treatment.  ACC/AHA risk was 2.8% done today with pt.    Patient's last menstrual period was 10/19/2007.          Sexually active: Yes.    The current method of family planning is post menopausal status.    Exercising: Yes.     Smoker:  no  Health Maintenance: Pap:  05/20/2011 Negative History of abnormal Pap:  no MMG:  08/04/2020 Additional images recommended.  Follow up normal.   Colonoscopy:  12/12/2018, follow up 10 years BMD:   ordered Screening Labs: done with Dr. Felipa Eth   reports that she has quit smoking. She has never used smokeless tobacco. She reports current alcohol use of about 3.0 standard drinks per week. She reports that she does not use drugs.  Past Medical History:  Diagnosis Date   Allergy    Anxiety 09-27-11   no panic attacks-tx. Lexapro   Dysrhythmia    irregular - "sometimes tacky"   GERD (gastroesophageal reflux disease) 09-27-11    past,no meds in 5 yrs   Hyperlipidemia    Melanoma ([place]) 09-27-11   left ankle, basal -face   Thumb injury 09-27-11   cut Rt. Thumb on broken glass, is stitched   Tubular adenoma of colon     Past Surgical History:  Procedure Laterality Date   COLONOSCOPY     x2   COLONOSCOPY W/ POLYPECTOMY     ear surgery 2008 left     I & D EXTREMITY  09/28/2011   Procedure: IRRIGATION AND DEBRIDEMENT EXTREMITY;  Surgeon: Willa Frater III;  Location: [facility];  Service: Orthopedics;  Laterality: Right;  Irrigation and Debridement    MELANOMA EXCISION Left    ankle   TRICEPS TENDON REPAIR Left 12/15/2019   Procedure: Open repair triceps tendon and bony  fracture repair with ulnar nerve release left elbow;  Surgeon: Roseanne Kaufman, MD;  Location: [place];  Service: Orthopedics;  Laterality: Left;  1.5 hrs   VARICOSE VEIN SURGERY Bilateral    2x    Current Outpatient Medications  Medication Sig Dispense Refill   escitalopram (LEXAPRO) 10 MG tablet Take 1 tablet (10 mg total) by mouth daily. 90 tablet 1   ibuprofen (ADVIL,MOTRIN) 200 MG tablet Take 400 mg by mouth every 6 (six) hours as needed for pain or headache.     No current facility-administered medications for this visit.    Family History  Problem Relation Age of Onset   Diabetes Mother    Hypertension Mother    Hypertension Father    Heart disease Father    Heart failure Father    Diabetes Maternal Grandmother    Diabetes Maternal Grandfather    Diabetes Paternal Grandmother    Heart disease Paternal Grandmother    Cancer Paternal Grandmother        pancreatic   Pancreatic cancer Paternal Grandmother    Diabetes Paternal Grandfather    Rectal cancer Neg Hx    Stomach cancer Neg Hx    Colon polyps Neg Hx    Colon cancer Neg Hx    Esophageal  cancer Neg Hx    Breast cancer Neg Hx     Review of Systems  All other systems reviewed and are negative.  Exam:   BP 130/83 (BP Location: Right Arm, Patient Position: Sitting, Cuff Size: Normal)   Pulse 85   Ht 5\' 7"  (1.702 m) Comment: reported  Wt 163 lb 6.4 oz (74.1 kg)   LMP 10/19/2007   BMI 25.59 kg/m   Height: 5\' 7"  (170.2 cm) (reported)  General appearance: alert, cooperative and appears stated age Head: Normocephalic, without obvious abnormality, atraumatic Neck: no adenopathy, supple, symmetrical, trachea midline and thyroid normal to inspection and palpation Lungs: clear to auscultation bilaterally Breasts: normal appearance, no masses or tenderness Heart: regular rate and rhythm Abdomen: soft, non-tender; bowel sounds normal; no masses,  no organomegaly Extremities: extremities normal, atraumatic, no cyanosis  or edema Skin: Skin color, texture, turgor normal. No rashes or lesions Lymph nodes: Cervical, supraclavicular, and axillary nodes normal. No abnormal inguinal nodes palpated Neurologic: Grossly normal   Pelvic: External genitalia:  no lesions              Urethra:  normal appearing urethra with no masses, tenderness or lesions              Bartholins and Skenes: normal                 Vagina: normal appearing vagina with normal color and no discharge, no lesions              Cervix: no lesions              Pap taken: Yes.   Bimanual Exam:  Uterus:  normal size, contour, position, consistency, mobility, non-tender              Adnexa: normal adnexa and no mass, fullness, tenderness               Rectovaginal: Confirms               Anus:  normal sphincter tone, no lesions  Chaperone, Octaviano Batty, CMA, was present for exam.  Assessment/Plan: 1. Well woman exam with routine gynecological exam - Pap and HR HPV obtained today - BMD ordered - colonoscopy 11/2018 - MMG scheduled for December - lab work done with Dr. Felipa Eth in 2021 - vaccinations reviewed  2. Cervical cancer screening - Cytology - PAP( [place])  3. Hypoestrogenism - DG BONE DENSITY (DXA); Future  4. Postmenopausal - no HRT  5. Pure hypercholesterolemia  6. Anxiety - on lexapro

## 2021-09-24 LAB — CYTOLOGY - PAP
Comment: NEGATIVE
Diagnosis: NEGATIVE
High risk HPV: NEGATIVE

## 2021-09-28 DIAGNOSIS — F4323 Adjustment disorder with mixed anxiety and depressed mood: Secondary | ICD-10-CM | POA: Diagnosis not present

## 2021-09-30 ENCOUNTER — Other Ambulatory Visit: Payer: Self-pay

## 2021-09-30 ENCOUNTER — Ambulatory Visit
Admission: RE | Admit: 2021-09-30 | Discharge: 2021-09-30 | Disposition: A | Discharge status: Home / Self Care | Payer: 59 | Source: Ambulatory Visit | Attending: Geriatric Medicine | Admitting: Geriatric Medicine

## 2021-09-30 DIAGNOSIS — Z1231 Encounter for screening mammogram for malignant neoplasm of breast: Secondary | ICD-10-CM | POA: Diagnosis not present

## 2021-10-21 DIAGNOSIS — F4323 Adjustment disorder with mixed anxiety and depressed mood: Secondary | ICD-10-CM | POA: Diagnosis not present

## 2021-10-28 ENCOUNTER — Encounter (HOSPITAL_BASED_OUTPATIENT_CLINIC_OR_DEPARTMENT_OTHER): Payer: Self-pay | Admitting: Cardiology

## 2021-10-28 ENCOUNTER — Ambulatory Visit (INDEPENDENT_AMBULATORY_CARE_PROVIDER_SITE_OTHER): Payer: 59 | Admitting: Cardiology

## 2021-10-28 ENCOUNTER — Other Ambulatory Visit: Payer: Self-pay

## 2021-10-28 VITALS — BP 102/82 | HR 64 | Ht 67.0 in | Wt 161.6 lb

## 2021-10-28 DIAGNOSIS — E7800 Pure hypercholesterolemia, unspecified: Secondary | ICD-10-CM | POA: Diagnosis not present

## 2021-10-28 DIAGNOSIS — Z7189 Other specified counseling: Secondary | ICD-10-CM | POA: Diagnosis not present

## 2021-10-28 DIAGNOSIS — R002 Palpitations: Secondary | ICD-10-CM

## 2021-10-28 NOTE — Patient Instructions (Signed)
Medication Instructions:  Your Physician recommend you continue on your current medication as directed.    *If you need a refill on your cardiac medications before your next appointment, please call your pharmacy*   Lab Work: None ordered today   Testing/Procedures: CT coronary calcium score.   Test locations:  [facility] ([address] [place], [place] [address]) [facility] ([address] [place], [place] [address])   This is $99 out of pocket.   Coronary CalciumScan A coronary calcium scan is an imaging test used to look for deposits of calcium and other fatty materials (plaques) in the inner lining of the blood vessels of the heart (coronary arteries). These deposits of calcium and plaques can partly clog and narrow the coronary arteries without producing any symptoms or warning signs. This puts a person at risk for a heart attack. This test can detect these deposits before symptoms develop. Tell a health care provider about: Any allergies you have. All medicines you are taking, including vitamins, herbs, eye drops, creams, and over-the-counter medicines. Any problems you or family members have had with anesthetic medicines. Any blood disorders you have. Any surgeries you have had. Any medical conditions you have. Whether you are pregnant or may be pregnant. What are the risks? Generally, this is a safe procedure. However, problems may occur, including: Harm to a pregnant woman and her unborn baby. This test involves the use of radiation. Radiation exposure can be dangerous to a pregnant woman and her unborn baby. If you are pregnant, you generally should not have this procedure done. Slight increase in the risk of cancer. This is because of the radiation involved in the test. What happens before the procedure? No preparation is needed for this procedure. What happens during the procedure? You will undress and remove any jewelry around your neck or  chest. You will put on a hospital gown. Sticky electrodes will be placed on your chest. The electrodes will be connected to an electrocardiogram (ECG) machine to record a tracing of the electrical activity of your heart. A CT scanner will take pictures of your heart. During this time, you will be asked to lie still and hold your breath for 2-3 seconds while a picture of your heart is being taken. The procedure may vary among health care providers and hospitals. What happens after the procedure? You can get dressed. You can return to your normal activities. It is up to you to get the results of your test. Ask your health care provider, or the department that is doing the test, when your results will be ready. Summary A coronary calcium scan is an imaging test used to look for deposits of calcium and other fatty materials (plaques) in the inner lining of the blood vessels of the heart (coronary arteries). Generally, this is a safe procedure. Tell your health care provider if you are pregnant or may be pregnant. No preparation is needed for this procedure. A CT scanner will take pictures of your heart. You can return to your normal activities after the scan is done. This information is not intended to replace advice given to you by your health care provider. Make sure you discuss any questions you have with your health care provider. Document Released: 04/01/2008 Document Revised: 08/23/2016 Document Reviewed: 08/23/2016 Elsevier Interactive Patient Education  2017 [place]: At [facility], you and your health needs are our priority.  As part of our continuing mission to provide you with exceptional heart  care, we have created designated Provider Care Teams.  These Care Teams include your primary Cardiologist (physician) and Advanced Practice Providers (APPs -  Physician Assistants and Nurse Practitioners) who all work together to provide you with the care you need, when you  need it.  We recommend signing up for the patient portal called "MyChart".  Sign up information is provided on this After Visit Summary.  MyChart is used to connect with patients for Virtual Visits (Telemedicine).  Patients are able to view lab/test results, encounter notes, upcoming appointments, etc.  Non-urgent messages can be sent to your provider as well.   To learn more about what you can do with MyChart, go to [email or web address].    Your next appointment:   Based on test results   The format for your next appointment:   In Person  Provider:   Buford Dresser, MD

## 2021-10-28 NOTE — Progress Notes (Incomplete)
Cardiology Office Note:    Date:  10/28/2021   ID:  Stephanie Benson, DOB 30-Apr-1961, MRN [id number]  PCP:  Lajean Manes, MD  Cardiologist:  Buford Dresser, MD  Referring MD: Megan Salon, MD   No chief complaint on file.   History of Present Illness:    Stephanie Benson is a 61 y.o. female with a hx of dysrhythmia, hyperlipidemia, GERD, melanoma, and anxiety who is seen as a new consult at the request of Megan Salon, MD for the evaluation and management of hyperlipidemia.  Ms. Devaux saw Dr. Sabra Heck 09/21/2021 and reported having elevated cholesterol for 10+ years. Labs with Dr. Felipa Eth (06/2020) were reviewed, showed total cholesterol 258, LDL 157, and HDL 84. ACC/AHA risk was found to be 2.8%.  Cardiovascular risk factors: Prior clinical ASCVD: None Comorbid conditions: Hyperlipidemia, not controlled with medication. Endorses hypotension.  Metabolic syndrome/Obesity: Current weight 161 lbs. Chronic inflammatory conditions: None Tobacco use history: Former smoker when she was in college. None currently. Family history: Grandfather had diabetes, obesity, and heart issues. Father may have died of heart attack, but she notes he had other health issues as well. Also reports her mother is on a statin. Prior cardiac testing and/or incidental findings on other testing (ie coronary calcium): Exercise level: Walks her dog 3 times a day. Regularly uses a [employer] bike, more often in the summer. Enjoys skiing. Current diet: Tries to follow a healthy diet. She has cut back on meat, but notes she eats too much sugar.   About 2-3 times a year, she experiences an episode of rapid and irregular heartbeats. This is random and very unpredictable. For a while she used a [employer], but does not believe any significant results were found.  Of note, she had melanoma when she was 61 yo, but this has never recurred.  She denies any chest pain, or shortness of breath. No  lightheadedness, headaches, syncope, orthopnea, PND, lower extremity edema or exertional symptoms.  Past Medical History:  Diagnosis Date   Allergy    Anxiety 09/27/2011   no panic attacks-tx. Lexapro   Dysrhythmia    irregular - "sometimes tacky"   GERD (gastroesophageal reflux disease) 09/27/2011   past,no meds in 5 yrs   Hyperlipidemia    Melanoma ([place]) 09/26/1981   left ankle, basal -face   Thumb injury 09/27/2011   cut Rt. Thumb on broken glass, is stitched   Tubular adenoma of colon     Past Surgical History:  Procedure Laterality Date   COLONOSCOPY     x2   COLONOSCOPY W/ POLYPECTOMY     ear surgery 2008 left     I & D EXTREMITY  09/28/2011   Procedure: IRRIGATION AND DEBRIDEMENT EXTREMITY;  Surgeon: Willa Frater III;  [facility]: [facility];  Service: Orthopedics;  Laterality: Right;  Irrigation and Debridement    MELANOMA EXCISION Left    ankle   TRICEPS TENDON REPAIR Left 12/15/2019   Procedure: Open repair triceps tendon and bony fracture repair with ulnar nerve release left elbow;  Surgeon: Roseanne Kaufman, MD;  Location: [place];  Service: Orthopedics;  Laterality: Left;  1.5 hrs   VARICOSE VEIN SURGERY Bilateral    2x    Current Medications: Current Outpatient Medications on File Prior to Visit  Medication Sig   escitalopram (LEXAPRO) 10 MG tablet Take 1 tablet (10 mg total) by mouth daily.   ibuprofen (ADVIL,MOTRIN) 200 MG tablet Take 400 mg by mouth every 6 (six) hours as  needed for pain or headache. (Patient not taking: Reported on 10/28/2021)   No current facility-administered medications on file prior to visit.     Allergies:   Patient has no known allergies.   Social History   Tobacco Use   Smoking status: Former   Smokeless tobacco: Never   Tobacco comments:    rare in college  Vaping Use   Vaping Use: Never used  Substance Use Topics   Alcohol use: Yes    Alcohol/week: 3.0 standard drinks    Types: 3 Glasses of wine per week   Drug use: No     Family History: family history includes Cancer in her paternal grandmother; Diabetes in her maternal grandfather, maternal grandmother, mother, paternal grandfather, and paternal grandmother; Heart disease in her father and paternal grandmother; Heart failure in her father; Hypertension in her father and mother; Pancreatic cancer in her paternal grandmother. There is no history of Rectal cancer, Stomach cancer, Colon polyps, Colon cancer, Esophageal cancer, or Breast cancer.  ROS:   Please see the history of present illness.  Additional pertinent ROS: Constitutional: Negative for chills, fever, night sweats, unintentional weight loss  HENT: Negative for ear pain and hearing loss.   Eyes: Negative for loss of vision and eye pain.  Respiratory: Negative for cough, sputum, wheezing.   Cardiovascular: See HPI. Gastrointestinal: Negative for abdominal pain, melena, and hematochezia.  Genitourinary: Negative for dysuria and hematuria.  Musculoskeletal: Negative for falls and myalgias.  Skin: Negative for itching and rash.  Neurological: Negative for focal weakness, focal sensory changes and loss of consciousness.  Endo/Heme/Allergies: Does not bruise/bleed easily.     EKGs/Labs/Other Studies Reviewed:    The following studies were reviewed today:  Right LE Venous Doppler 02/11/2015: FINDINGS: Contralateral Common Femoral Vein: Respiratory phasicity is normal and symmetric with the symptomatic side. No evidence of thrombus. Normal compressibility.   Common Femoral Vein: No evidence of thrombus. Normal compressibility, respiratory phasicity and response to augmentation.   Saphenofemoral Junction: No evidence of thrombus. Normal compressibility and flow on color Doppler imaging.   Profunda Femoral Vein: No evidence of thrombus. Normal compressibility and flow on color Doppler imaging.   Femoral Vein: No evidence of thrombus. Normal compressibility, respiratory phasicity and response  to augmentation.   Popliteal Vein: No evidence of thrombus. Normal compressibility, respiratory phasicity and response to augmentation.   Calf Veins: No evidence of thrombus. Normal compressibility and flow on color Doppler imaging.   Superficial Great Saphenous Vein: Not visualized. Patient reportedly has past medical history of vein stripping.   Venous Reflux:  None.   Other Findings:  None.   IMPRESSION: No evidence of deep venous thrombosis seen in right lower extremity.  EKG:  EKG is personally reviewed.   10/28/2021: ***  Recent Labs: No results found for requested labs within last 8760 hours.   Recent Lipid Panel    Component Value Date/Time   CHOL 260 (H) 08/09/2012 1116   TRIG 56 08/09/2012 1116   HDL 77 08/09/2012 1116   CHOLHDL 3.4 08/09/2012 1116   VLDL 11 08/09/2012 1116   LDLCALC 172 (H) 08/09/2012 1116    Physical Exam:    VS:  BP 102/82 (BP Location: Left Arm, Patient Position: Sitting, Cuff Size: Normal)    Pulse 64    Ht 5\' 7"  (1.702 m)    Wt 161 lb 9.6 oz (73.3 kg)    LMP 10/19/2007    SpO2 99%    BMI 25.31 kg/m  Wt Readings from Last 3 Encounters:  10/28/21 161 lb 9.6 oz (73.3 kg)  09/21/21 163 lb 6.4 oz (74.1 kg)  08/25/20 162 lb 12.8 oz (73.8 kg)    GEN: Well nourished, well developed in no acute distress HEENT: Normal, moist mucous membranes NECK: No JVD CARDIAC: regular rhythm, normal S1 and S2, no rubs or gallops. No murmur. VASCULAR: Radial and DP pulses 2+ bilaterally. No carotid bruits RESPIRATORY:  Clear to auscultation without rales, wheezing or rhonchi  ABDOMEN: Soft, non-tender, non-distended MUSCULOSKELETAL:  Ambulates independently SKIN: Warm and dry, no edema NEUROLOGIC:  Alert and oriented x 3. No focal neuro deficits noted. PSYCHIATRIC:  Normal affect    ASSESSMENT:    1. Pure hypercholesterolemia   2. Cardiac risk counseling   3. Counseling on health promotion and disease prevention   4. Heart palpitations     PLAN:     Cardiac risk counseling and prevention recommendations: -recommend heart healthy/Mediterranean diet, with whole grains, fruits, vegetable, fish, lean meats, nuts, and olive oil. Limit salt. -recommend moderate walking, 3-5 times/week for 30-50 minutes each session. Aim for at least 150 minutes.week. Goal should be pace of 3 miles/hours, or walking 1.5 miles in 30 minutes -recommend avoidance of tobacco products. Avoid excess alcohol. -ASCVD risk score: The ASCVD Risk score (Arnett DK, et al., 2019) failed to calculate for the following reasons:   Cannot find a previous HDL lab   Cannot find a previous total cholesterol lab    Plan for follow up: TBD based on results of calcium score.  Buford Dresser, MD, PhD, [facility]    Medication Adjustments/Labs and Tests Ordered: Current medicines are reviewed at length with the patient today.  Concerns regarding medicines are outlined above.   No orders of the defined types were placed in this encounter.  No orders of the defined types were placed in this encounter.  There are no Patient Instructions on file for this visit.   I,Mathew Stumpf,acting as a Education administrator for [employer], MD.,have documented all relevant documentation on the behalf of Buford Dresser, MD,as directed by  Buford Dresser, MD while in the presence of Buford Dresser, MD.  ***  Signed, Buford Dresser, MD PhD 10/28/2021 10:11 AM    [place]

## 2021-11-04 DIAGNOSIS — F4323 Adjustment disorder with mixed anxiety and depressed mood: Secondary | ICD-10-CM | POA: Diagnosis not present

## 2021-11-18 DIAGNOSIS — F4323 Adjustment disorder with mixed anxiety and depressed mood: Secondary | ICD-10-CM | POA: Diagnosis not present

## 2021-11-26 ENCOUNTER — Other Ambulatory Visit: Payer: Self-pay

## 2021-11-26 ENCOUNTER — Ambulatory Visit (INDEPENDENT_AMBULATORY_CARE_PROVIDER_SITE_OTHER)
Admission: RE | Admit: 2021-11-26 | Discharge: 2021-11-26 | Disposition: A | Discharge status: Home / Self Care | Payer: Self-pay | Source: Ambulatory Visit | Attending: Cardiology | Admitting: Cardiology

## 2021-11-26 DIAGNOSIS — E7800 Pure hypercholesterolemia, unspecified: Secondary | ICD-10-CM

## 2021-12-01 DIAGNOSIS — F4323 Adjustment disorder with mixed anxiety and depressed mood: Secondary | ICD-10-CM | POA: Diagnosis not present

## 2021-12-08 DIAGNOSIS — F4323 Adjustment disorder with mixed anxiety and depressed mood: Secondary | ICD-10-CM | POA: Diagnosis not present

## 2021-12-10 ENCOUNTER — Encounter (HOSPITAL_BASED_OUTPATIENT_CLINIC_OR_DEPARTMENT_OTHER): Payer: Self-pay | Admitting: Obstetrics & Gynecology

## 2021-12-10 ENCOUNTER — Ambulatory Visit (INDEPENDENT_AMBULATORY_CARE_PROVIDER_SITE_OTHER): Payer: 59

## 2021-12-10 ENCOUNTER — Other Ambulatory Visit: Payer: Self-pay

## 2021-12-10 ENCOUNTER — Other Ambulatory Visit (HOSPITAL_COMMUNITY): Payer: Self-pay

## 2021-12-10 DIAGNOSIS — Z85828 Personal history of other malignant neoplasm of skin: Secondary | ICD-10-CM | POA: Diagnosis not present

## 2021-12-10 DIAGNOSIS — D2239 Melanocytic nevi of other parts of face: Secondary | ICD-10-CM | POA: Diagnosis not present

## 2021-12-10 DIAGNOSIS — L814 Other melanin hyperpigmentation: Secondary | ICD-10-CM | POA: Diagnosis not present

## 2021-12-10 DIAGNOSIS — Z85820 Personal history of malignant melanoma of skin: Secondary | ICD-10-CM | POA: Diagnosis not present

## 2021-12-10 DIAGNOSIS — D2362 Other benign neoplasm of skin of left upper limb, including shoulder: Secondary | ICD-10-CM | POA: Diagnosis not present

## 2021-12-10 DIAGNOSIS — L720 Epidermal cyst: Secondary | ICD-10-CM | POA: Diagnosis not present

## 2021-12-10 DIAGNOSIS — L820 Inflamed seborrheic keratosis: Secondary | ICD-10-CM | POA: Diagnosis not present

## 2021-12-10 DIAGNOSIS — L570 Actinic keratosis: Secondary | ICD-10-CM | POA: Diagnosis not present

## 2021-12-10 DIAGNOSIS — R300 Dysuria: Secondary | ICD-10-CM

## 2021-12-10 LAB — POCT URINALYSIS DIPSTICK
Bilirubin, UA: NEGATIVE
Glucose, UA: NEGATIVE
Ketones, UA: NEGATIVE
Nitrite, UA: NEGATIVE
Protein, UA: NEGATIVE
Spec Grav, UA: >=1.03 — AB (ref 1.010–1.025)
Urobilinogen, UA: 0.2 E.U./dL
pH, UA: 5 (ref 5.0–8.0)

## 2021-12-10 MED ORDER — NITROFURANTOIN MONOHYD MACRO 100 MG PO CAPS
100.0000 mg | ORAL_CAPSULE | Freq: Two times a day (BID) | ORAL | 0 refills | Status: DC
  Filled 2021-12-10: qty 14, 7d supply, fill #0

## 2021-12-10 MED ORDER — PHENAZOPYRIDINE HCL 200 MG PO TABS: 200.0000 mg | ORAL_TABLET | Freq: Three times a day (TID) | ORAL | 0 refills | Status: DC | PRN

## 2021-12-10 MED ORDER — NITROFURANTOIN MONOHYD MACRO 100 MG PO CAPS: 100.0000 mg | ORAL_CAPSULE | Freq: Two times a day (BID) | ORAL | 0 refills | Status: DC

## 2021-12-10 MED ORDER — NITROFURANTOIN MONOHYD MACRO 100 MG PO CAPS
100.0000 mg | ORAL_CAPSULE | Freq: Two times a day (BID) | ORAL | 0 refills | Status: DC
  Filled 2021-12-10: qty 10, 5d supply, fill #0

## 2021-12-10 MED ORDER — PHENAZOPYRIDINE HCL 200 MG PO TABS
200.0000 mg | ORAL_TABLET | Freq: Three times a day (TID) | ORAL | 0 refills | Status: DC | PRN
  Filled 2021-12-10: qty 6, 2d supply, fill #0

## 2021-12-10 NOTE — Progress Notes (Signed)
Patient called today with complaint of pain while urinating. She states she has urgency and frequency. Patient was asked to come in to give a urine specimen for analysis. Per protocol, Macrobid 100mg  twice a day for 5 days and Pyridium 200mg  three times a day for 2 days was sent into the requested pharmacy. Urine was sent for culture. Patient advised that she would be notified either by phone and/or Mychart. tbw

## 2021-12-13 LAB — URINE CULTURE

## 2021-12-16 DIAGNOSIS — F4323 Adjustment disorder with mixed anxiety and depressed mood: Secondary | ICD-10-CM | POA: Diagnosis not present

## 2021-12-21 ENCOUNTER — Encounter (HOSPITAL_BASED_OUTPATIENT_CLINIC_OR_DEPARTMENT_OTHER): Payer: Self-pay | Admitting: Obstetrics & Gynecology

## 2021-12-21 ENCOUNTER — Other Ambulatory Visit (HOSPITAL_COMMUNITY): Payer: Self-pay

## 2021-12-21 ENCOUNTER — Other Ambulatory Visit (HOSPITAL_BASED_OUTPATIENT_CLINIC_OR_DEPARTMENT_OTHER): Payer: Self-pay | Admitting: Obstetrics & Gynecology

## 2021-12-21 MED ORDER — SULFAMETHOXAZOLE-TRIMETHOPRIM 800-160 MG PO TABS
1.0000 | ORAL_TABLET | Freq: Two times a day (BID) | ORAL | 0 refills | Status: DC
  Filled 2021-12-21: qty 6, 3d supply, fill #0

## 2021-12-25 DIAGNOSIS — H524 Presbyopia: Secondary | ICD-10-CM | POA: Diagnosis not present

## 2021-12-30 DIAGNOSIS — F4323 Adjustment disorder with mixed anxiety and depressed mood: Secondary | ICD-10-CM | POA: Diagnosis not present

## 2022-01-13 DIAGNOSIS — F4323 Adjustment disorder with mixed anxiety and depressed mood: Secondary | ICD-10-CM | POA: Diagnosis not present

## 2022-01-27 DIAGNOSIS — F4323 Adjustment disorder with mixed anxiety and depressed mood: Secondary | ICD-10-CM | POA: Diagnosis not present

## 2022-02-09 DIAGNOSIS — F4323 Adjustment disorder with mixed anxiety and depressed mood: Secondary | ICD-10-CM | POA: Diagnosis not present

## 2022-02-23 ENCOUNTER — Other Ambulatory Visit (HOSPITAL_COMMUNITY): Payer: Self-pay

## 2022-02-24 DIAGNOSIS — F4323 Adjustment disorder with mixed anxiety and depressed mood: Secondary | ICD-10-CM | POA: Diagnosis not present

## 2022-03-03 ENCOUNTER — Other Ambulatory Visit (HOSPITAL_COMMUNITY): Payer: Self-pay

## 2022-03-05 ENCOUNTER — Inpatient Hospital Stay: Admission: RE | Admit: 2022-03-05 | Payer: 59 | Source: Ambulatory Visit

## 2022-03-09 ENCOUNTER — Other Ambulatory Visit (HOSPITAL_COMMUNITY): Payer: Self-pay

## 2022-03-09 MED ORDER — ESCITALOPRAM OXALATE 10 MG PO TABS
ORAL_TABLET | ORAL | 1 refills | Status: DC
  Filled 2022-03-09: qty 90, 90d supply, fill #0
  Filled 2022-08-31: qty 90, 90d supply, fill #1

## 2022-03-10 DIAGNOSIS — F4323 Adjustment disorder with mixed anxiety and depressed mood: Secondary | ICD-10-CM | POA: Diagnosis not present

## 2022-03-17 ENCOUNTER — Other Ambulatory Visit (HOSPITAL_COMMUNITY): Payer: Self-pay

## 2022-03-24 DIAGNOSIS — F4323 Adjustment disorder with mixed anxiety and depressed mood: Secondary | ICD-10-CM | POA: Diagnosis not present

## 2022-04-06 ENCOUNTER — Other Ambulatory Visit (HOSPITAL_COMMUNITY): Payer: Self-pay

## 2022-04-14 DIAGNOSIS — F4323 Adjustment disorder with mixed anxiety and depressed mood: Secondary | ICD-10-CM | POA: Diagnosis not present

## 2022-04-26 DIAGNOSIS — E7800 Pure hypercholesterolemia, unspecified: Secondary | ICD-10-CM | POA: Diagnosis not present

## 2022-04-26 DIAGNOSIS — Z0000 Encounter for general adult medical examination without abnormal findings: Secondary | ICD-10-CM | POA: Diagnosis not present

## 2022-04-26 DIAGNOSIS — Z23 Encounter for immunization: Secondary | ICD-10-CM | POA: Diagnosis not present

## 2022-04-27 DIAGNOSIS — E7800 Pure hypercholesterolemia, unspecified: Secondary | ICD-10-CM | POA: Diagnosis not present

## 2022-04-27 DIAGNOSIS — Z79899 Other long term (current) drug therapy: Secondary | ICD-10-CM | POA: Diagnosis not present

## 2022-05-11 DIAGNOSIS — F4323 Adjustment disorder with mixed anxiety and depressed mood: Secondary | ICD-10-CM | POA: Diagnosis not present

## 2022-05-17 DIAGNOSIS — L570 Actinic keratosis: Secondary | ICD-10-CM | POA: Diagnosis not present

## 2022-05-17 DIAGNOSIS — I788 Other diseases of capillaries: Secondary | ICD-10-CM | POA: Diagnosis not present

## 2022-05-17 DIAGNOSIS — L821 Other seborrheic keratosis: Secondary | ICD-10-CM | POA: Diagnosis not present

## 2022-05-17 DIAGNOSIS — Z85828 Personal history of other malignant neoplasm of skin: Secondary | ICD-10-CM | POA: Diagnosis not present

## 2022-05-17 DIAGNOSIS — D1801 Hemangioma of skin and subcutaneous tissue: Secondary | ICD-10-CM | POA: Diagnosis not present

## 2022-05-17 DIAGNOSIS — Z85820 Personal history of malignant melanoma of skin: Secondary | ICD-10-CM | POA: Diagnosis not present

## 2022-05-26 DIAGNOSIS — F4323 Adjustment disorder with mixed anxiety and depressed mood: Secondary | ICD-10-CM | POA: Diagnosis not present

## 2022-05-28 DIAGNOSIS — Z79899 Other long term (current) drug therapy: Secondary | ICD-10-CM | POA: Diagnosis not present

## 2022-06-09 DIAGNOSIS — F4323 Adjustment disorder with mixed anxiety and depressed mood: Secondary | ICD-10-CM | POA: Diagnosis not present

## 2022-06-23 ENCOUNTER — Other Ambulatory Visit (HOSPITAL_COMMUNITY): Payer: Self-pay

## 2022-06-23 DIAGNOSIS — F4323 Adjustment disorder with mixed anxiety and depressed mood: Secondary | ICD-10-CM | POA: Diagnosis not present

## 2022-06-23 MED ORDER — PAXLOVID (300/100) 20 X 150 MG & 10 X 100MG PO TBPK
ORAL_TABLET | ORAL | 0 refills | Status: DC
Start: 2022-06-23 — End: 2023-12-07
  Filled 2022-06-23: qty 30, 5d supply, fill #0

## 2022-07-06 DIAGNOSIS — F4323 Adjustment disorder with mixed anxiety and depressed mood: Secondary | ICD-10-CM | POA: Diagnosis not present

## 2022-08-04 DIAGNOSIS — F4323 Adjustment disorder with mixed anxiety and depressed mood: Secondary | ICD-10-CM | POA: Diagnosis not present

## 2022-08-17 ENCOUNTER — Other Ambulatory Visit: Payer: Self-pay | Admitting: Obstetrics & Gynecology

## 2022-08-17 DIAGNOSIS — Z1231 Encounter for screening mammogram for malignant neoplasm of breast: Secondary | ICD-10-CM

## 2022-08-31 ENCOUNTER — Other Ambulatory Visit (HOSPITAL_COMMUNITY): Payer: Self-pay

## 2022-09-01 ENCOUNTER — Other Ambulatory Visit (HOSPITAL_COMMUNITY): Payer: Self-pay

## 2022-09-01 DIAGNOSIS — F4323 Adjustment disorder with mixed anxiety and depressed mood: Secondary | ICD-10-CM | POA: Diagnosis not present

## 2022-09-15 DIAGNOSIS — F4323 Adjustment disorder with mixed anxiety and depressed mood: Secondary | ICD-10-CM | POA: Diagnosis not present

## 2022-09-29 DIAGNOSIS — F4323 Adjustment disorder with mixed anxiety and depressed mood: Secondary | ICD-10-CM | POA: Diagnosis not present

## 2022-10-07 ENCOUNTER — Other Ambulatory Visit: Payer: Self-pay

## 2022-10-07 ENCOUNTER — Other Ambulatory Visit (HOSPITAL_COMMUNITY): Payer: Self-pay

## 2022-10-07 DIAGNOSIS — M25551 Pain in right hip: Secondary | ICD-10-CM | POA: Diagnosis not present

## 2022-10-07 DIAGNOSIS — M79674 Pain in right toe(s): Secondary | ICD-10-CM | POA: Diagnosis not present

## 2022-10-07 MED ORDER — PREDNISONE 5 MG (21) PO TBPK
ORAL_TABLET | ORAL | 0 refills | Status: DC
  Filled 2022-10-07 (×3): qty 21, 6d supply, fill #0

## 2022-10-15 ENCOUNTER — Ambulatory Visit
Admission: RE | Admit: 2022-10-15 | Discharge: 2022-10-15 | Disposition: A | Discharge status: Home / Self Care | Payer: 59 | Source: Ambulatory Visit | Attending: Obstetrics & Gynecology | Admitting: Obstetrics & Gynecology

## 2022-10-15 DIAGNOSIS — Z1231 Encounter for screening mammogram for malignant neoplasm of breast: Secondary | ICD-10-CM

## 2022-10-27 DIAGNOSIS — M25551 Pain in right hip: Secondary | ICD-10-CM | POA: Diagnosis not present

## 2022-11-03 DIAGNOSIS — F4323 Adjustment disorder with mixed anxiety and depressed mood: Secondary | ICD-10-CM | POA: Diagnosis not present

## 2022-11-16 DIAGNOSIS — M25551 Pain in right hip: Secondary | ICD-10-CM | POA: Diagnosis not present

## 2022-11-17 DIAGNOSIS — F4323 Adjustment disorder with mixed anxiety and depressed mood: Secondary | ICD-10-CM | POA: Diagnosis not present

## 2022-11-23 DIAGNOSIS — M25551 Pain in right hip: Secondary | ICD-10-CM | POA: Diagnosis not present

## 2022-12-01 DIAGNOSIS — M25551 Pain in right hip: Secondary | ICD-10-CM | POA: Diagnosis not present

## 2022-12-01 DIAGNOSIS — F4323 Adjustment disorder with mixed anxiety and depressed mood: Secondary | ICD-10-CM | POA: Diagnosis not present

## 2022-12-07 ENCOUNTER — Other Ambulatory Visit: Payer: Self-pay

## 2022-12-15 DIAGNOSIS — M25551 Pain in right hip: Secondary | ICD-10-CM | POA: Diagnosis not present

## 2022-12-15 DIAGNOSIS — F4323 Adjustment disorder with mixed anxiety and depressed mood: Secondary | ICD-10-CM | POA: Diagnosis not present

## 2022-12-29 DIAGNOSIS — F4323 Adjustment disorder with mixed anxiety and depressed mood: Secondary | ICD-10-CM | POA: Diagnosis not present

## 2023-01-03 ENCOUNTER — Other Ambulatory Visit (HOSPITAL_COMMUNITY): Payer: Self-pay

## 2023-01-03 ENCOUNTER — Other Ambulatory Visit: Payer: Self-pay

## 2023-01-03 MED ORDER — ESCITALOPRAM OXALATE 10 MG PO TABS
10.0000 mg | ORAL_TABLET | Freq: Every day | ORAL | 1 refills | Status: DC
  Filled 2023-01-03: qty 90, 90d supply, fill #0
  Filled 2023-06-26: qty 90, 90d supply, fill #1

## 2023-01-19 DIAGNOSIS — F4323 Adjustment disorder with mixed anxiety and depressed mood: Secondary | ICD-10-CM | POA: Diagnosis not present

## 2023-02-02 DIAGNOSIS — F4323 Adjustment disorder with mixed anxiety and depressed mood: Secondary | ICD-10-CM | POA: Diagnosis not present

## 2023-02-18 DIAGNOSIS — F4323 Adjustment disorder with mixed anxiety and depressed mood: Secondary | ICD-10-CM | POA: Diagnosis not present

## 2023-02-23 ENCOUNTER — Other Ambulatory Visit (HOSPITAL_COMMUNITY): Payer: Self-pay

## 2023-02-23 DIAGNOSIS — B0089 Other herpesviral infection: Secondary | ICD-10-CM | POA: Diagnosis not present

## 2023-02-23 DIAGNOSIS — Z85820 Personal history of malignant melanoma of skin: Secondary | ICD-10-CM | POA: Diagnosis not present

## 2023-02-23 DIAGNOSIS — Z85828 Personal history of other malignant neoplasm of skin: Secondary | ICD-10-CM | POA: Diagnosis not present

## 2023-02-23 MED ORDER — VALACYCLOVIR HCL 1 G PO TABS
ORAL_TABLET | ORAL | 0 refills | Status: DC
  Filled 2023-02-23: qty 12, 14d supply, fill #0

## 2023-03-09 DIAGNOSIS — F4323 Adjustment disorder with mixed anxiety and depressed mood: Secondary | ICD-10-CM | POA: Diagnosis not present

## 2023-04-06 DIAGNOSIS — F4323 Adjustment disorder with mixed anxiety and depressed mood: Secondary | ICD-10-CM | POA: Diagnosis not present

## 2023-04-11 DIAGNOSIS — M25571 Pain in right ankle and joints of right foot: Secondary | ICD-10-CM | POA: Diagnosis not present

## 2023-04-20 DIAGNOSIS — F4323 Adjustment disorder with mixed anxiety and depressed mood: Secondary | ICD-10-CM | POA: Diagnosis not present

## 2023-04-20 DIAGNOSIS — M25571 Pain in right ankle and joints of right foot: Secondary | ICD-10-CM | POA: Diagnosis not present

## 2023-05-04 ENCOUNTER — Other Ambulatory Visit (HOSPITAL_COMMUNITY): Payer: Self-pay

## 2023-05-04 ENCOUNTER — Other Ambulatory Visit: Payer: Self-pay

## 2023-05-04 DIAGNOSIS — E7800 Pure hypercholesterolemia, unspecified: Secondary | ICD-10-CM | POA: Diagnosis not present

## 2023-05-04 DIAGNOSIS — F411 Generalized anxiety disorder: Secondary | ICD-10-CM | POA: Diagnosis not present

## 2023-05-04 DIAGNOSIS — Z0000 Encounter for general adult medical examination without abnormal findings: Secondary | ICD-10-CM | POA: Diagnosis not present

## 2023-05-05 ENCOUNTER — Other Ambulatory Visit (HOSPITAL_COMMUNITY): Payer: Self-pay

## 2023-05-05 ENCOUNTER — Other Ambulatory Visit: Payer: Self-pay

## 2023-05-05 MED ORDER — VALACYCLOVIR HCL 1 G PO TABS
4000.0000 mg | ORAL_TABLET | ORAL | 2 refills | Status: AC
  Filled 2023-05-05: qty 12, 3d supply, fill #0
  Filled 2024-02-16: qty 12, 3d supply, fill #1

## 2023-05-11 ENCOUNTER — Other Ambulatory Visit (HOSPITAL_COMMUNITY): Payer: Self-pay

## 2023-05-11 DIAGNOSIS — F4323 Adjustment disorder with mixed anxiety and depressed mood: Secondary | ICD-10-CM | POA: Diagnosis not present

## 2023-05-11 MED ORDER — ROSUVASTATIN CALCIUM 5 MG PO TABS
5.0000 mg | ORAL_TABLET | Freq: Every day | ORAL | 2 refills | Status: DC
  Filled 2023-05-11: qty 30, 30d supply, fill #0
  Filled 2023-06-05: qty 30, 30d supply, fill #1
  Filled 2023-07-23: qty 30, 30d supply, fill #2

## 2023-05-25 DIAGNOSIS — F4323 Adjustment disorder with mixed anxiety and depressed mood: Secondary | ICD-10-CM | POA: Diagnosis not present

## 2023-05-30 DIAGNOSIS — D1801 Hemangioma of skin and subcutaneous tissue: Secondary | ICD-10-CM | POA: Diagnosis not present

## 2023-05-30 DIAGNOSIS — Z85820 Personal history of malignant melanoma of skin: Secondary | ICD-10-CM | POA: Diagnosis not present

## 2023-05-30 DIAGNOSIS — L570 Actinic keratosis: Secondary | ICD-10-CM | POA: Diagnosis not present

## 2023-05-30 DIAGNOSIS — L814 Other melanin hyperpigmentation: Secondary | ICD-10-CM | POA: Diagnosis not present

## 2023-05-30 DIAGNOSIS — Z85828 Personal history of other malignant neoplasm of skin: Secondary | ICD-10-CM | POA: Diagnosis not present

## 2023-05-30 DIAGNOSIS — D2239 Melanocytic nevi of other parts of face: Secondary | ICD-10-CM | POA: Diagnosis not present

## 2023-06-06 ENCOUNTER — Other Ambulatory Visit: Payer: Self-pay

## 2023-06-08 DIAGNOSIS — F4323 Adjustment disorder with mixed anxiety and depressed mood: Secondary | ICD-10-CM | POA: Diagnosis not present

## 2023-06-22 DIAGNOSIS — F4323 Adjustment disorder with mixed anxiety and depressed mood: Secondary | ICD-10-CM | POA: Diagnosis not present

## 2023-06-26 ENCOUNTER — Other Ambulatory Visit (HOSPITAL_COMMUNITY): Payer: Self-pay

## 2023-06-27 ENCOUNTER — Other Ambulatory Visit (HOSPITAL_COMMUNITY): Payer: Self-pay

## 2023-06-29 DIAGNOSIS — F4323 Adjustment disorder with mixed anxiety and depressed mood: Secondary | ICD-10-CM | POA: Diagnosis not present

## 2023-07-04 DIAGNOSIS — Z79899 Other long term (current) drug therapy: Secondary | ICD-10-CM | POA: Diagnosis not present

## 2023-07-13 DIAGNOSIS — F4323 Adjustment disorder with mixed anxiety and depressed mood: Secondary | ICD-10-CM | POA: Diagnosis not present

## 2023-07-23 ENCOUNTER — Other Ambulatory Visit (HOSPITAL_COMMUNITY): Payer: Self-pay

## 2023-07-27 DIAGNOSIS — F4323 Adjustment disorder with mixed anxiety and depressed mood: Secondary | ICD-10-CM | POA: Diagnosis not present

## 2023-08-10 DIAGNOSIS — F4323 Adjustment disorder with mixed anxiety and depressed mood: Secondary | ICD-10-CM | POA: Diagnosis not present

## 2023-08-10 IMAGING — MG MM DIGITAL SCREENING BILAT W/ TOMO AND CAD
8 series · 8 of 24 positions shown · non-contrast
Comparison: Previous exam(s).

CLINICAL DATA: Screening.

EXAM:
DIGITAL SCREENING BILATERAL MAMMOGRAM WITH TOMOSYNTHESIS AND CAD
TECHNIQUE: Bilateral screening digital craniocaudal and mediolateral oblique
mammograms were obtained. Bilateral screening digital breast
tomosynthesis was performed. The images were evaluated with
computer-aided detection.

[L MLO synth-2D]
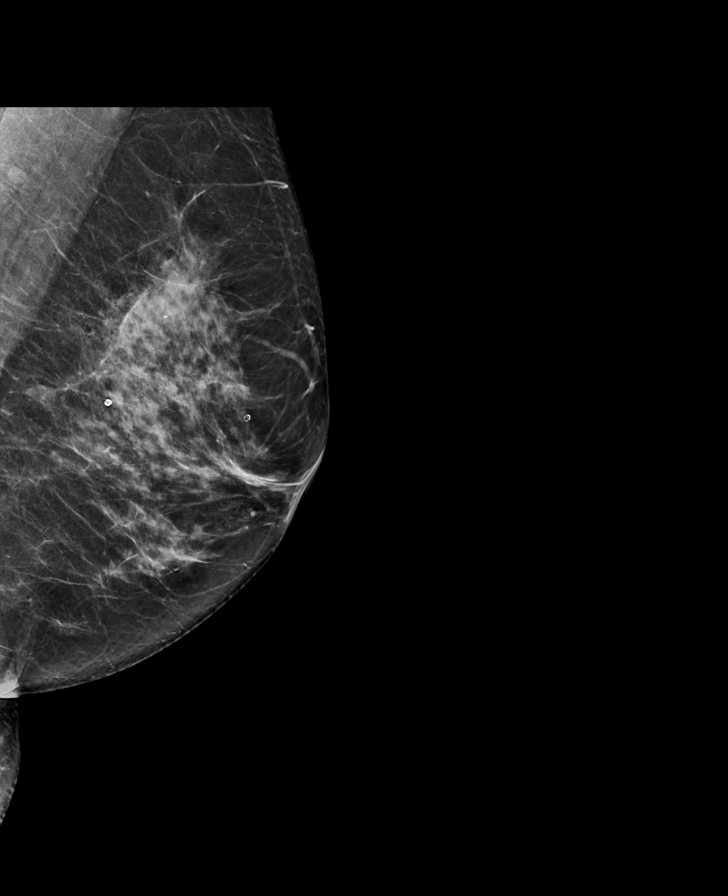

[R CC synth-2D]
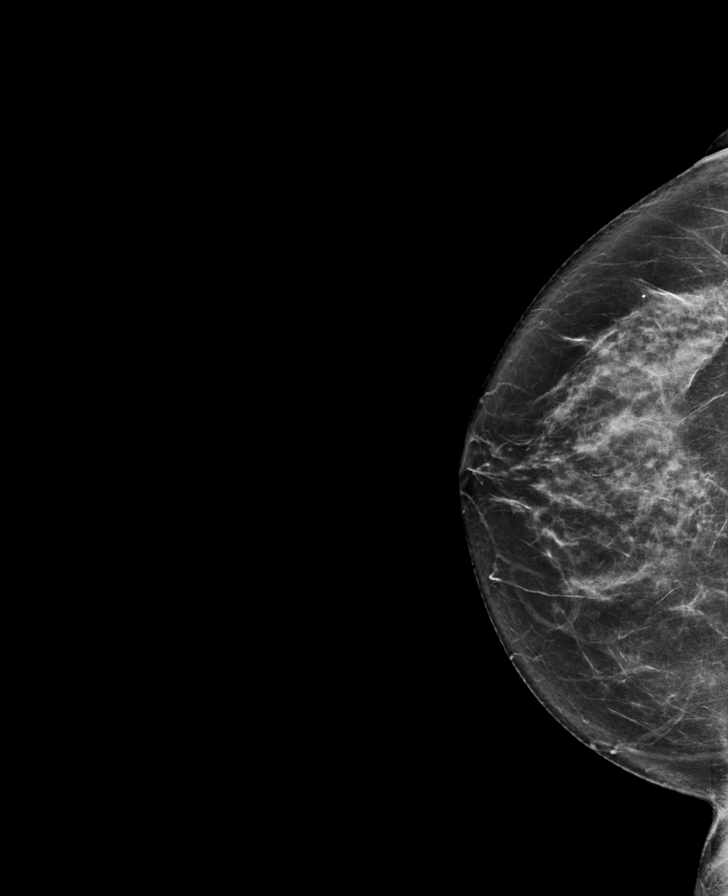

[R MLO synth-2D]
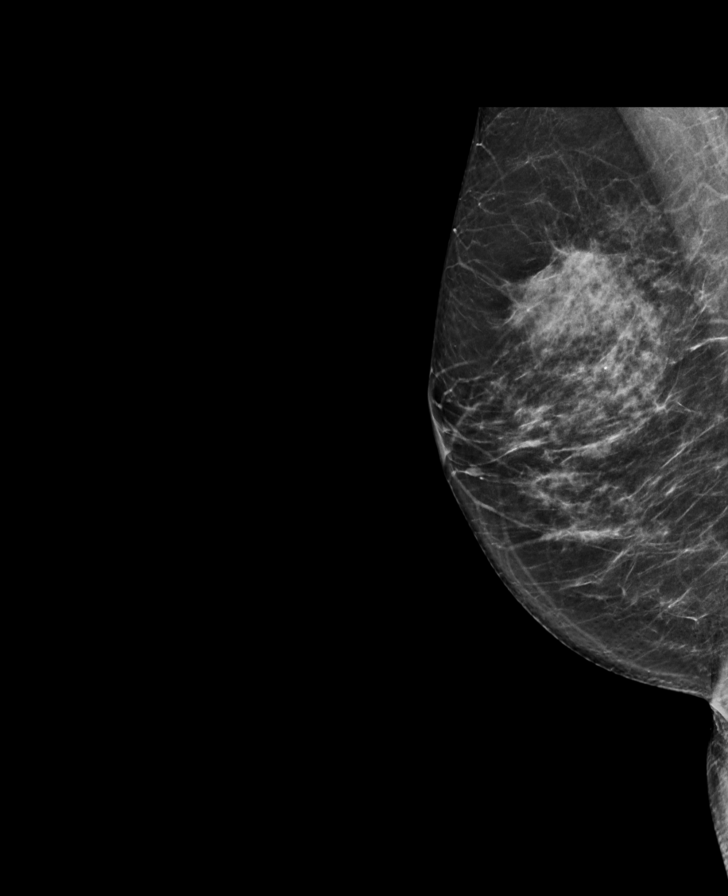

[L CC synth-2D]
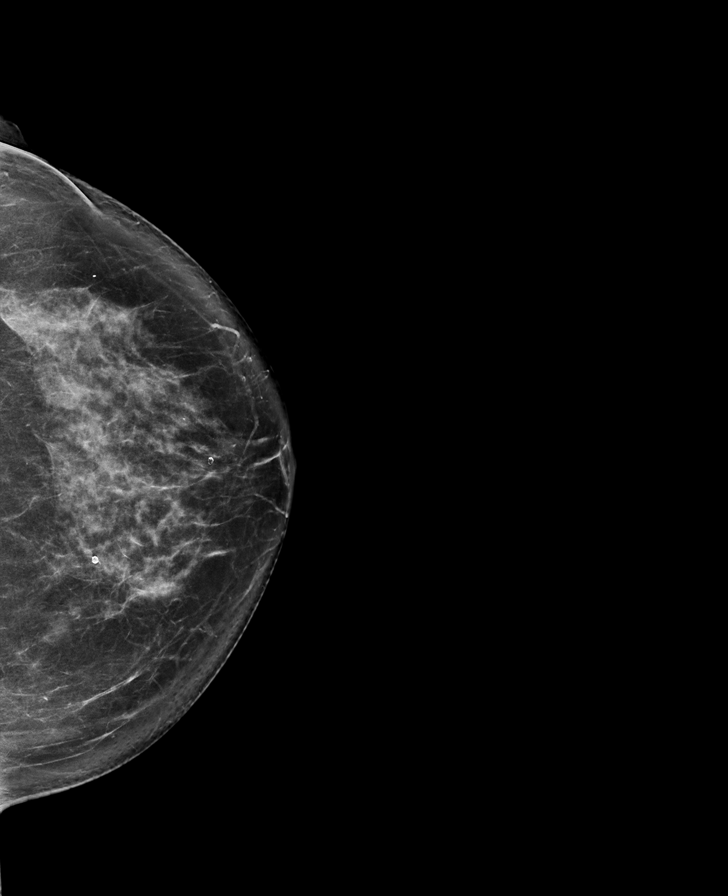

[R MLO tomo · tomo slice 37/72.0]
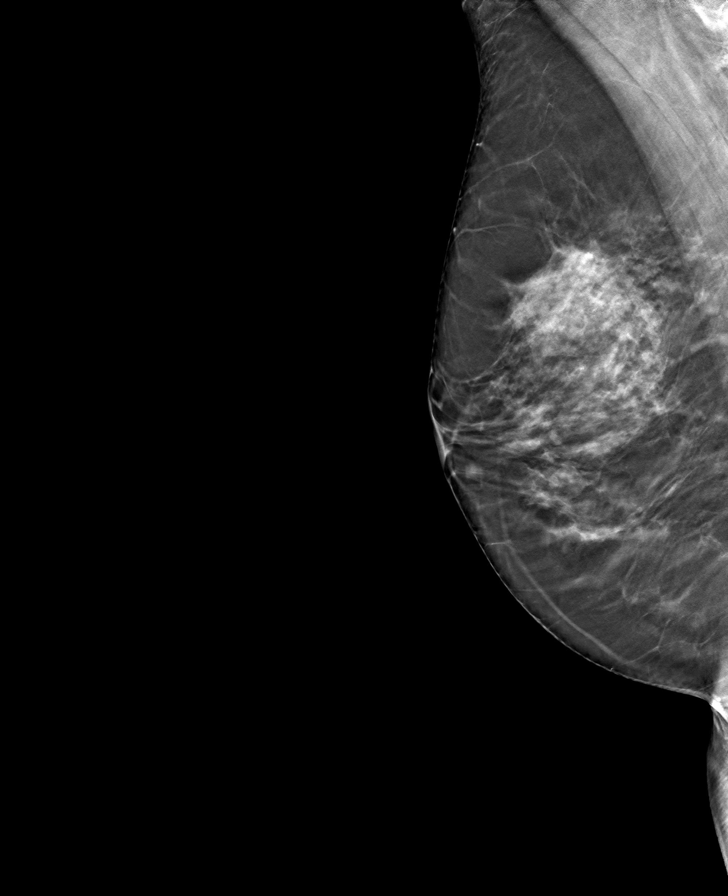

[R CC tomo · tomo slice 41/80.0]
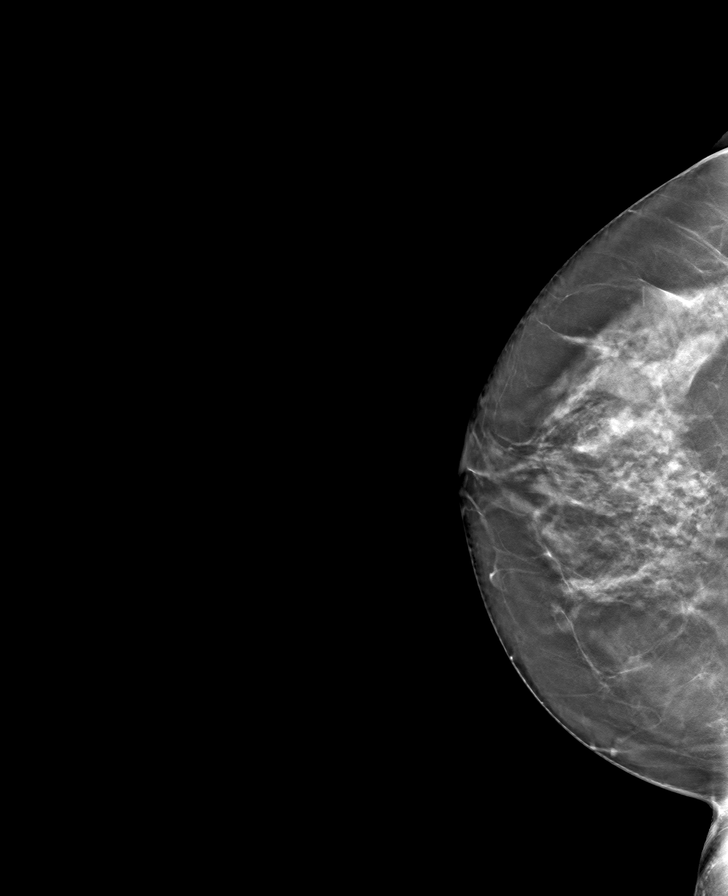

[L CC tomo · tomo slice 43/84.0]
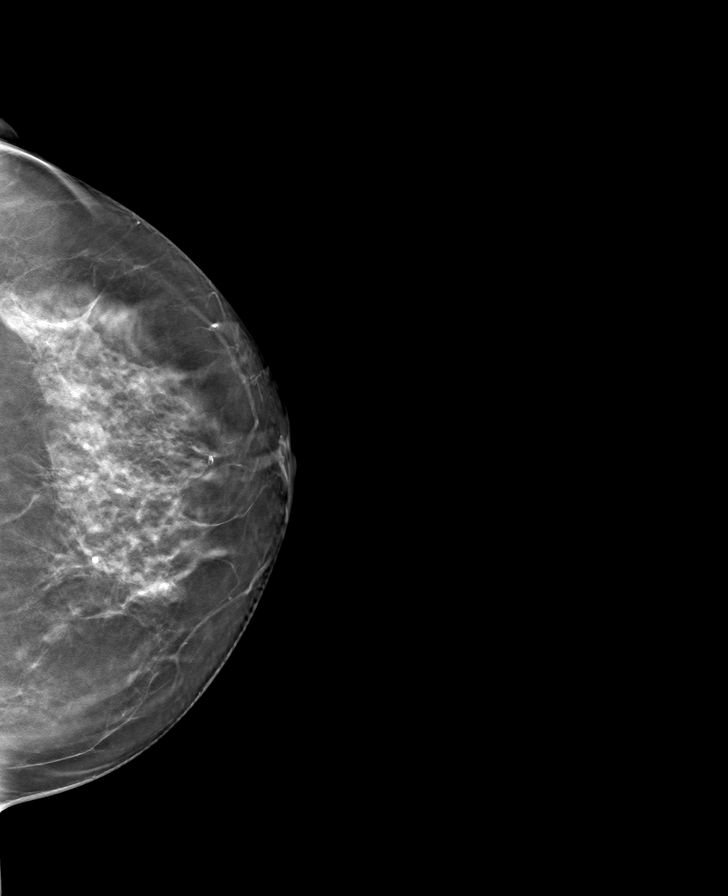

[L MLO tomo · tomo slice 37/74.0]
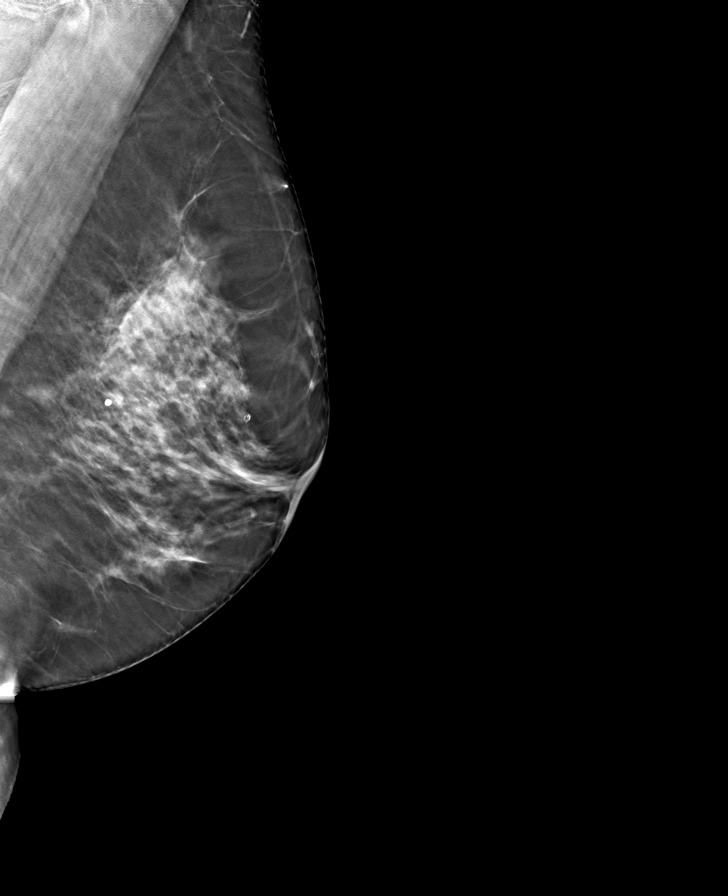

[8 of 24 positions shown; findings below may reference images not displayed]

ACR Breast Density Category c: The breast tissue is heterogeneously
dense, which may obscure small masses.
FINDINGS: There are no findings suspicious for malignancy.
IMPRESSION: No mammographic evidence of malignancy. A result letter of this
screening mammogram will be mailed directly to the patient.

RECOMMENDATION:
Screening mammogram in one year. (Code:Q3-W-BC3)

BI-RADS CATEGORY  1: Negative.

## 2023-08-17 ENCOUNTER — Other Ambulatory Visit (HOSPITAL_COMMUNITY): Payer: Self-pay

## 2023-08-17 MED ORDER — ROSUVASTATIN CALCIUM 5 MG PO TABS
5.0000 mg | ORAL_TABLET | Freq: Every day | ORAL | 3 refills | Status: DC
  Filled 2023-08-17: qty 90, 90d supply, fill #0
  Filled 2023-11-17: qty 90, 90d supply, fill #1
  Filled 2024-02-16: qty 90, 90d supply, fill #2
  Filled 2024-06-08: qty 90, 90d supply, fill #3

## 2023-08-18 DIAGNOSIS — F4323 Adjustment disorder with mixed anxiety and depressed mood: Secondary | ICD-10-CM | POA: Diagnosis not present

## 2023-08-24 DIAGNOSIS — F4323 Adjustment disorder with mixed anxiety and depressed mood: Secondary | ICD-10-CM | POA: Diagnosis not present

## 2023-09-05 DIAGNOSIS — H524 Presbyopia: Secondary | ICD-10-CM | POA: Diagnosis not present

## 2023-09-07 DIAGNOSIS — F4323 Adjustment disorder with mixed anxiety and depressed mood: Secondary | ICD-10-CM | POA: Diagnosis not present

## 2023-09-21 ENCOUNTER — Other Ambulatory Visit (HOSPITAL_COMMUNITY): Payer: Self-pay

## 2023-09-21 DIAGNOSIS — F4323 Adjustment disorder with mixed anxiety and depressed mood: Secondary | ICD-10-CM | POA: Diagnosis not present

## 2023-09-27 ENCOUNTER — Other Ambulatory Visit: Payer: Self-pay

## 2023-09-27 ENCOUNTER — Other Ambulatory Visit (HOSPITAL_COMMUNITY): Payer: Self-pay

## 2023-09-27 DIAGNOSIS — F4323 Adjustment disorder with mixed anxiety and depressed mood: Secondary | ICD-10-CM | POA: Diagnosis not present

## 2023-09-27 MED ORDER — ESCITALOPRAM OXALATE 10 MG PO TABS
10.0000 mg | ORAL_TABLET | Freq: Every day | ORAL | 1 refills | Status: DC
  Filled 2023-09-27: qty 90, 90d supply, fill #0
  Filled 2023-12-21 – 2023-12-30 (×2): qty 90, 90d supply, fill #1

## 2023-10-03 DIAGNOSIS — F4323 Adjustment disorder with mixed anxiety and depressed mood: Secondary | ICD-10-CM | POA: Diagnosis not present

## 2023-10-07 ENCOUNTER — Ambulatory Visit (HOSPITAL_BASED_OUTPATIENT_CLINIC_OR_DEPARTMENT_OTHER): Payer: Commercial Managed Care - PPO | Admitting: Obstetrics & Gynecology

## 2023-10-26 DIAGNOSIS — F4323 Adjustment disorder with mixed anxiety and depressed mood: Secondary | ICD-10-CM | POA: Diagnosis not present

## 2023-11-01 ENCOUNTER — Ambulatory Visit (HOSPITAL_BASED_OUTPATIENT_CLINIC_OR_DEPARTMENT_OTHER): Payer: Commercial Managed Care - PPO | Admitting: Obstetrics & Gynecology

## 2023-11-02 DIAGNOSIS — F4323 Adjustment disorder with mixed anxiety and depressed mood: Secondary | ICD-10-CM | POA: Diagnosis not present

## 2023-11-16 DIAGNOSIS — F4323 Adjustment disorder with mixed anxiety and depressed mood: Secondary | ICD-10-CM | POA: Diagnosis not present

## 2023-11-17 ENCOUNTER — Other Ambulatory Visit (HOSPITAL_COMMUNITY): Payer: Self-pay

## 2023-11-23 DIAGNOSIS — F4323 Adjustment disorder with mixed anxiety and depressed mood: Secondary | ICD-10-CM | POA: Diagnosis not present

## 2023-11-24 ENCOUNTER — Ambulatory Visit (HOSPITAL_BASED_OUTPATIENT_CLINIC_OR_DEPARTMENT_OTHER): Payer: 59 | Admitting: Obstetrics & Gynecology

## 2023-11-25 ENCOUNTER — Other Ambulatory Visit: Payer: Self-pay | Admitting: Obstetrics & Gynecology

## 2023-11-25 DIAGNOSIS — Z1231 Encounter for screening mammogram for malignant neoplasm of breast: Secondary | ICD-10-CM

## 2023-12-02 ENCOUNTER — Ambulatory Visit: Payer: 59

## 2023-12-07 ENCOUNTER — Other Ambulatory Visit (HOSPITAL_COMMUNITY): Payer: Self-pay

## 2023-12-07 ENCOUNTER — Encounter (HOSPITAL_BASED_OUTPATIENT_CLINIC_OR_DEPARTMENT_OTHER): Payer: Self-pay | Admitting: Obstetrics & Gynecology

## 2023-12-07 ENCOUNTER — Ambulatory Visit (HOSPITAL_BASED_OUTPATIENT_CLINIC_OR_DEPARTMENT_OTHER): Payer: 59 | Admitting: Obstetrics & Gynecology

## 2023-12-07 ENCOUNTER — Encounter (HOSPITAL_COMMUNITY): Payer: Self-pay

## 2023-12-07 VITALS — BP 108/74 | HR 69 | Ht 60.65 in | Wt 155.0 lb

## 2023-12-07 DIAGNOSIS — F4323 Adjustment disorder with mixed anxiety and depressed mood: Secondary | ICD-10-CM | POA: Diagnosis not present

## 2023-12-07 DIAGNOSIS — Z13820 Encounter for screening for osteoporosis: Secondary | ICD-10-CM

## 2023-12-07 DIAGNOSIS — Z01419 Encounter for gynecological examination (general) (routine) without abnormal findings: Secondary | ICD-10-CM

## 2023-12-07 DIAGNOSIS — Z780 Asymptomatic menopausal state: Secondary | ICD-10-CM | POA: Diagnosis not present

## 2023-12-07 DIAGNOSIS — N952 Postmenopausal atrophic vaginitis: Secondary | ICD-10-CM

## 2023-12-07 MED ORDER — ESTRADIOL 0.1 MG/GM VA CREA
TOPICAL_CREAM | VAGINAL | 1 refills | Status: DC
  Filled 2023-12-07 – 2023-12-21 (×2): qty 42.5, 90d supply, fill #0

## 2023-12-07 NOTE — Progress Notes (Signed)
ANNUAL EXAM Patient name: Stephanie Benson MRN [id number]  Date of birth: August 27, 1961 Chief Complaint:   Annual Exam  History of Present Illness:   Stephanie Benson is a 63 y.o. [phone] Caucasian female being seen today for a routine annual exam.  Doing well.  Denies vaginal bleeding.  Having vaginal dryness.  Would like treatment. Has not done BMD.  Willing to do this.  Needs new order.    Denies vaginal bleeding.    Patient's last menstrual period was 10/19/2007.   Last pap 09/21/2021. Results were: NILM w/ HRHPV negative. H/O abnormal pap: no Last mammogram: 10/15/2022. Results were: normal. Family h/o breast cancer: no Last colonoscopy: 12/12/2018. Results were: normal. Family h/o colorectal cancer: no     12/07/2023   10:36 AM 09/21/2021    3:24 PM  Depression screen PHQ 2/9  Decreased Interest 0 0  Down, Depressed, Hopeless 0 0  PHQ - 2 Score 0 0         No data to display           Review of Systems:   Pertinent items are noted in HPI Denies any headaches, blurred vision, fatigue, shortness of breath, chest pain, abdominal pain, abnormal vaginal discharge/itching/odor/irritation, problems with periods, bowel movements, urination, or intercourse unless otherwise stated above. Pertinent History Reviewed:  Reviewed past medical,surgical, social and family history.  Reviewed problem list, medications and allergies. Physical Assessment:   Vitals:   12/07/23 1034  BP: 108/74  Pulse: 69  Weight: 155 lb (70.3 kg)  Height: 5' 0.65" (1.541 m)  Body mass index is 29.63 kg/m.        Physical Examination:   General appearance - well appearing, and in no distress  Mental status - alert, oriented to person, place, and time  Psych:  She has a normal mood and affect  Skin - warm and dry, normal color, no suspicious lesions noted  Chest - effort normal, all lung fields clear to auscultation bilaterally  Heart - normal rate and regular rhythm  Neck:  midline trachea,  no thyromegaly or nodules  Breasts - breasts appear normal, no suspicious masses, no skin or nipple changes or  axillary nodes  Abdomen - soft, nontender, nondistended, no masses or organomegaly  Pelvic - VULVA: normal appearing vulva with no masses, tenderness or lesions   VAGINA: normal appearing vagina with normal color and discharge, no lesions   CERVIX: normal appearing cervix without discharge or lesions, no CMT  Thin prep pap is not obtained today  UTERUS: uterus is felt to be normal size, shape, consistency and nontender   ADNEXA: No adnexal masses or tenderness noted.  Rectal - normal rectal, good sphincter tone, no masses felt  Extremities:  No swelling or varicosities noted  Chaperone present for exam  Assessment & Plan:  1. Well woman exam with routine gynecological exam (Primary) - Pap smear 09/2021 - Mammogram scheduled for 12/2023 - Colonoscopy 2022.  Follow up 10 years. - Bone mineral density ordered - lab work done with PCP, Dr. Margaretann Loveless - vaccines reviewed/updated  2. Postmenopausal  3. Osteoporosis screening - DG BONE DENSITY (DXA); Future  4. Vaginal atrophy - estradiol (ESTRACE) 0.1 MG/GM vaginal cream; 1 gram vaginally twice weekly  Dispense: 42.5 g; Refill: 1    Meds:  Meds ordered this encounter  Medications   estradiol (ESTRACE) 0.1 MG/GM vaginal cream    Sig: 1 gram vaginally twice weekly    Dispense:  42.5 g  Refill:  1    Follow-up: Return in about 1 year (around 12/06/2024).  Jerene Bears, MD 12/07/2023 11:23 AM

## 2023-12-07 NOTE — Patient Instructions (Signed)
Call [phone] to schedule an appointment at [facility].

## 2023-12-08 ENCOUNTER — Other Ambulatory Visit (HOSPITAL_COMMUNITY): Payer: Self-pay

## 2023-12-08 ENCOUNTER — Other Ambulatory Visit: Payer: Self-pay

## 2023-12-14 DIAGNOSIS — F4323 Adjustment disorder with mixed anxiety and depressed mood: Secondary | ICD-10-CM | POA: Diagnosis not present

## 2023-12-21 ENCOUNTER — Other Ambulatory Visit: Payer: Self-pay

## 2023-12-21 ENCOUNTER — Other Ambulatory Visit (HOSPITAL_BASED_OUTPATIENT_CLINIC_OR_DEPARTMENT_OTHER): Payer: 59

## 2023-12-21 ENCOUNTER — Other Ambulatory Visit (HOSPITAL_COMMUNITY): Payer: Self-pay

## 2023-12-21 DIAGNOSIS — F4323 Adjustment disorder with mixed anxiety and depressed mood: Secondary | ICD-10-CM | POA: Diagnosis not present

## 2023-12-22 ENCOUNTER — Ambulatory Visit
Admission: RE | Admit: 2023-12-22 | Discharge: 2023-12-22 | Disposition: A | Discharge status: Home / Self Care | Payer: 59 | Source: Ambulatory Visit | Attending: Obstetrics & Gynecology | Admitting: Obstetrics & Gynecology

## 2023-12-22 DIAGNOSIS — Z1231 Encounter for screening mammogram for malignant neoplasm of breast: Secondary | ICD-10-CM

## 2023-12-26 ENCOUNTER — Ambulatory Visit (HOSPITAL_BASED_OUTPATIENT_CLINIC_OR_DEPARTMENT_OTHER)
Admission: RE | Admit: 2023-12-26 | Discharge: 2023-12-26 | Disposition: A | Discharge status: Home / Self Care | Source: Ambulatory Visit | Attending: Obstetrics & Gynecology | Admitting: Obstetrics & Gynecology

## 2023-12-26 DIAGNOSIS — Z13820 Encounter for screening for osteoporosis: Secondary | ICD-10-CM | POA: Diagnosis not present

## 2023-12-26 DIAGNOSIS — M810 Age-related osteoporosis without current pathological fracture: Secondary | ICD-10-CM | POA: Diagnosis not present

## 2023-12-28 DIAGNOSIS — F4323 Adjustment disorder with mixed anxiety and depressed mood: Secondary | ICD-10-CM | POA: Diagnosis not present

## 2023-12-30 ENCOUNTER — Other Ambulatory Visit (HOSPITAL_COMMUNITY): Payer: Self-pay

## 2024-01-05 DIAGNOSIS — F4323 Adjustment disorder with mixed anxiety and depressed mood: Secondary | ICD-10-CM | POA: Diagnosis not present

## 2024-01-09 ENCOUNTER — Ambulatory Visit (INDEPENDENT_AMBULATORY_CARE_PROVIDER_SITE_OTHER): Admitting: Obstetrics & Gynecology

## 2024-01-09 ENCOUNTER — Encounter (HOSPITAL_BASED_OUTPATIENT_CLINIC_OR_DEPARTMENT_OTHER): Payer: Self-pay | Admitting: Obstetrics & Gynecology

## 2024-01-09 VITALS — BP 103/76 | HR 74 | Wt 155.8 lb

## 2024-01-09 DIAGNOSIS — M810 Age-related osteoporosis without current pathological fracture: Secondary | ICD-10-CM | POA: Diagnosis not present

## 2024-01-09 DIAGNOSIS — E559 Vitamin D deficiency, unspecified: Secondary | ICD-10-CM

## 2024-01-09 NOTE — Progress Notes (Signed)
 Subjective:    44 yrs Married Caucasian G4P2022  female here to discuss recent BMD obtained 12/26/2023 showing osteoporosis with t score -2.5.  .     Osteoporosis Risk Factors  Personal Hx of fracture as an adult: yes - had a fall and broke elbow Hx of fracture in first-degree relative: no Tobacco use: no Low body weight (<127 lbs): no Estrogen deficiency with either early menopause (age <45) or bilateral ovariectomy: no Low calcium intake (lifelong): no Alcohol use more than 2 drinks per day: no Recurrent falls: no Activity:  strength training twice daily.  Walking the dog.    Current calcium and Vit D intake:  no extra calcium or Vit D supplement  Past Medical History:  Diagnosis Date   Allergy    Anxiety 09/27/2011   no panic attacks-tx. Lexapro   Dysrhythmia    irregular - "sometimes tacky"   GERD (gastroesophageal reflux disease) 09/27/2011   past,no meds in 5 yrs   Hyperlipidemia    Melanoma (HCC) 09/26/1981   left ankle, basal -face   Thumb injury 09/27/2011   cut Rt. Thumb on broken glass, is stitched   Tubular adenoma of colon    Current Outpatient Medications on File Prior to Visit  Medication Sig Dispense Refill   escitalopram (LEXAPRO) 10 MG tablet Take 1 tablet (10 mg total) by mouth daily. 90 tablet 1   ibuprofen (ADVIL,MOTRIN) 200 MG tablet Take 400 mg by mouth every 6 (six) hours as needed for pain or headache.     rosuvastatin (CRESTOR) 5 MG tablet Take 1 tablet (5 mg total) by mouth daily. 90 tablet 3   valACYclovir (VALTREX) 1000 MG tablet Take 2 tablets by mouth now, then take 2 tablets 12 hours later. 12 tablet 2   estradiol (ESTRACE) 0.1 MG/GM vaginal cream Apply 1 gram vaginally twice weekly (Patient not taking: Reported on 01/09/2024) 42.5 g 1   No current facility-administered medications on file prior to visit.   No Known Allergies  Review of Systems Pertinent items are noted in HPI.     Objective:   PHYSICAL EXAM BP 103/76 (BP Location: Left  Arm, Patient Position: Sitting, Cuff Size: Large)   Pulse 74   Wt 155 lb 12.8 oz (70.7 kg)   LMP 10/19/2007   BMI 29.78 kg/m  General appearance: alert and no distress  Imaging Bone Density: Spine T Score: -2.5, Hip T Score: -2.6 one 3/10/205 %.                                         Assessment:   Osteoporosis with T score -2.6 in left femur   Plan:   1.  Treatment options with medications discussed including:   Bisphosphonates po and IV  Evist  Prolia subcutaneous   Forteo subcutaneous  Risks, benefits of medications, mechanism of action discussed.  Specifically osteonecrosis discussed.  2.  Patient counseled in adequate calcium and vitamin D.  1200mg  calcium and 1000 - 2000 I.U. discussed.  3.  30 minutes of weight bearing exercise at least 3 times weekly recommended.  She is going to discuss with her trainer and add more strength training that targets her hips/spine.  4.  Fall prevention discussed.  5.  Lab work ordered:  CMP, TSH, Vit D, and PTH  6.  Repeat BMD 2 years.      Total time with pt and documentation:  23 minutes

## 2024-01-10 LAB — COMPREHENSIVE METABOLIC PANEL
ALT: 21 IU/L (ref 0–32)
AST: 31 IU/L (ref 0–40)
Albumin: 4.4 g/dL (ref 3.9–4.9)
Alkaline Phosphatase: 89 IU/L (ref 44–121)
BUN/Creatinine Ratio: 14 (ref 12–28)
BUN: 12 mg/dL (ref 8–27)
Bilirubin Total: 0.6 mg/dL (ref 0.0–1.2)
CO2: 23 mmol/L (ref 20–29)
Calcium: 9.8 mg/dL (ref 8.7–10.3)
Chloride: 106 mmol/L (ref 96–106)
Creatinine, Ser: 0.84 mg/dL (ref 0.57–1.00)
Globulin, Total: 2.2 g/dL (ref 1.5–4.5)
Glucose: 81 mg/dL (ref 70–99)
Potassium: 4.6 mmol/L (ref 3.5–5.2)
Sodium: 144 mmol/L (ref 134–144)
Total Protein: 6.6 g/dL (ref 6.0–8.5)
eGFR: 79 mL/min/{1.73_m2} (ref >59)

## 2024-01-10 LAB — VITAMIN D 25 HYDROXY (VIT D DEFICIENCY, FRACTURES): Vit D, 25-Hydroxy: 14 ng/mL — ABNORMAL LOW (ref 30.0–100.0)

## 2024-01-10 LAB — PARATHYROID HORMONE, INTACT (NO CA): PTH: 34 pg/mL (ref 15–65)

## 2024-01-10 LAB — TSH: TSH: 1.76 u[IU]/mL (ref 0.450–4.500)

## 2024-01-11 DIAGNOSIS — F4323 Adjustment disorder with mixed anxiety and depressed mood: Secondary | ICD-10-CM | POA: Diagnosis not present

## 2024-01-22 ENCOUNTER — Encounter (HOSPITAL_BASED_OUTPATIENT_CLINIC_OR_DEPARTMENT_OTHER): Payer: Self-pay | Admitting: Obstetrics & Gynecology

## 2024-01-27 ENCOUNTER — Encounter (HOSPITAL_BASED_OUTPATIENT_CLINIC_OR_DEPARTMENT_OTHER): Payer: Self-pay | Admitting: Obstetrics & Gynecology

## 2024-01-27 ENCOUNTER — Other Ambulatory Visit: Payer: Self-pay

## 2024-01-27 ENCOUNTER — Other Ambulatory Visit (HOSPITAL_COMMUNITY): Payer: Self-pay

## 2024-01-27 MED ORDER — VITAMIN D (ERGOCALCIFEROL) 1.25 MG (50000 UNIT) PO CAPS
50000.0000 [IU] | ORAL_CAPSULE | ORAL | 0 refills | Status: DC
  Filled 2024-01-27: qty 12, 84d supply, fill #0

## 2024-01-27 NOTE — Addendum Note (Signed)
 Addended by: Jerene Bears on: 01/27/2024 01:40 PM   Modules accepted: Orders

## 2024-02-17 ENCOUNTER — Other Ambulatory Visit (HOSPITAL_COMMUNITY): Payer: Self-pay

## 2024-03-22 ENCOUNTER — Other Ambulatory Visit (HOSPITAL_COMMUNITY): Payer: Self-pay

## 2024-04-09 ENCOUNTER — Other Ambulatory Visit (HOSPITAL_COMMUNITY): Payer: Self-pay

## 2024-04-09 MED ORDER — ESCITALOPRAM OXALATE 10 MG PO TABS
10.0000 mg | ORAL_TABLET | Freq: Every day | ORAL | 1 refills | Status: DC
  Filled 2024-04-09 – 2024-04-18 (×2): qty 90, 90d supply, fill #0
  Filled 2024-07-09: qty 90, 90d supply, fill #1

## 2024-04-17 ENCOUNTER — Encounter (HOSPITAL_COMMUNITY): Payer: Self-pay

## 2024-04-18 ENCOUNTER — Other Ambulatory Visit (HOSPITAL_COMMUNITY): Payer: Self-pay

## 2024-04-26 ENCOUNTER — Ambulatory Visit (HOSPITAL_BASED_OUTPATIENT_CLINIC_OR_DEPARTMENT_OTHER): Admitting: *Deleted

## 2024-04-26 ENCOUNTER — Other Ambulatory Visit (HOSPITAL_BASED_OUTPATIENT_CLINIC_OR_DEPARTMENT_OTHER)

## 2024-04-26 VITALS — HR 92 | Ht 67.0 in | Wt 154.0 lb

## 2024-04-26 DIAGNOSIS — R000 Tachycardia, unspecified: Secondary | ICD-10-CM

## 2024-04-26 NOTE — Progress Notes (Signed)
   Nurse Visit   Date of Encounter: 04/26/2024 ID: Stephanie Benson, DOB 04-26-1961, MRN [id number]  PCP:  Dwight Trula SQUIBB, MD   [facility] Providers Cardiologist:  Shelda Bruckner, MD      Visit Details   VS:  Pulse 92   Ht 5' 7 (1.702 m)   Wt 154 lb (69.9 kg)   LMP 10/19/2007   BMI 24.12 kg/m  , BMI Body mass index is 24.12 kg/m.  Wt Readings from Last 3 Encounters:  04/26/24 154 lb (69.9 kg)  01/09/24 155 lb 12.8 oz (70.7 kg)  12/07/23 155 lb (70.3 kg)     Reason for visit: elevated heart rate Patient was checking out at grocery store today, HR 150 and felt a little lightheaded. Discussed with her husband Dr Benson. Dr Benson spoke with Dr Raford and had patient come for EKG. Discussed with patient who sometimes feels like heart is racing and sometimes racing and irregular.  Performed today: EKG and Provider consulted:EKG reviewed by Dr Raford NSR  Changes (medications, testing, etc.) : 14 day zio placed and if nothing shows up Apple watch or Kardia mobile. Reviewed information with patient. Keep follow up as scheduled Length of Visit: 15 minutes    Medications Adjustments/Labs and Tests Ordered: Orders Placed This Encounter  Procedures   LONG TERM MONITOR (3-14 DAYS)   EKG 12-Lead   No orders of the defined types were placed in this encounter.    Bonney Newell Birk, LPN  2/89/7974 4:77 PM

## 2024-04-26 NOTE — Patient Instructions (Signed)
 Kardia for monitoring of EKGs at home: You can look into the [facility] device by [employer].This device is purchased by you and it connects to an application you download to your smart phone.  It can detect abnormal heart rhythms and alert you to contact your doctor for further evaluation. The device is approximately $90 and the phone application is free.  The web site is:  [email or web address].alivecor.[email or web address]    ZIO XT- Long Term Monitor Instructions  Your physician has requested you wear a ZIO patch monitor for 14 days.  This is a single patch monitor. [employer] supplies one patch monitor per enrollment. Additional stickers are not available. Please do not apply patch if you will be having a Nuclear Stress Test,  Echocardiogram, Cardiac CT, MRI, or Chest Xray during the period you would be wearing the  monitor. The patch cannot be worn during these tests. You cannot remove and re-apply the  ZIO XT patch monitor.  Your ZIO patch monitor will be mailed 3 day USPS to your address on file. It may take 3-5 days  to receive your monitor after you have been enrolled.  Once you have received your monitor, please review the enclosed instructions. Your monitor  has already been registered assigning a specific monitor serial # to you.  Billing and Patient Assistance Program Information  We have supplied [employer] with any of your insurance information on file for billing purposes. [employer] offers a sliding scale Patient Assistance Program for patients that do not have  insurance, or whose insurance does not completely cover the cost of the ZIO monitor.  You must apply for the Patient Assistance Program to qualify for this discounted rate.  To apply, please call [employer] at [phone], select option 4, select option 2, ask to apply for  Patient Assistance Program. Meredeth will ask your household income, and how many people  are in your household. They will quote your out-of-pocket cost based on that information.   [employer] will also be able to set up a 51-month, interest-free payment plan if needed.  Applying the monitor   Shave hair from upper left chest.  Hold abrader disc by orange tab. Rub abrader in 40 strokes over the upper left chest as  indicated in your monitor instructions.  Clean area with 4 enclosed alcohol pads. Let dry.  Apply patch as indicated in monitor instructions. Patch will be placed under collarbone on left  side of chest with arrow pointing upward.  Rub patch adhesive wings for 2 minutes. Remove white label marked 1. Remove the white  label marked 2. Rub patch adhesive wings for 2 additional minutes.  While looking in a mirror, press and release button in center of patch. A small green light will  flash 3-4 times. This will be your only indicator that the monitor has been turned on.  Do not shower for the first 24 hours. You may shower after the first 24 hours.  Press the button if you feel a symptom. You will hear a small click. Record Date, Time and  Symptom in the Patient Logbook.  When you are ready to remove the patch, follow instructions on the last 2 pages of Patient  Logbook. Stick patch monitor onto the last page of Patient Logbook.  Place Patient Logbook in the blue and white box. Use locking tab on box and tape box closed  securely. The blue and white box has prepaid postage on it. Please place it in the mailbox as  soon as possible. Your  physician should have your test results approximately 7 days after the  monitor has been mailed back to [facility].  Call [facility] Customer Care at [phone] if you have questions regarding  your ZIO XT patch monitor. Call them immediately if you see an orange light blinking on your  monitor.  If your monitor falls off in less than 4 days, contact our Monitor department at [phone].  If your monitor becomes loose or falls off after 4 days call [employer] at [phone] for  suggestions on securing your  monitor

## 2024-04-27 ENCOUNTER — Ambulatory Visit (HOSPITAL_BASED_OUTPATIENT_CLINIC_OR_DEPARTMENT_OTHER)

## 2024-05-07 DIAGNOSIS — E7800 Pure hypercholesterolemia, unspecified: Secondary | ICD-10-CM | POA: Diagnosis not present

## 2024-05-07 DIAGNOSIS — Z0000 Encounter for general adult medical examination without abnormal findings: Secondary | ICD-10-CM | POA: Diagnosis not present

## 2024-05-07 DIAGNOSIS — I479 Paroxysmal tachycardia, unspecified: Secondary | ICD-10-CM | POA: Diagnosis not present

## 2024-05-07 DIAGNOSIS — Z79899 Other long term (current) drug therapy: Secondary | ICD-10-CM | POA: Diagnosis not present

## 2024-05-07 DIAGNOSIS — M810 Age-related osteoporosis without current pathological fracture: Secondary | ICD-10-CM | POA: Diagnosis not present

## 2024-05-07 DIAGNOSIS — E559 Vitamin D deficiency, unspecified: Secondary | ICD-10-CM | POA: Diagnosis not present

## 2024-05-07 DIAGNOSIS — F411 Generalized anxiety disorder: Secondary | ICD-10-CM | POA: Diagnosis not present

## 2024-05-09 ENCOUNTER — Encounter (HOSPITAL_BASED_OUTPATIENT_CLINIC_OR_DEPARTMENT_OTHER): Payer: Self-pay | Admitting: Obstetrics & Gynecology

## 2024-05-15 DIAGNOSIS — R000 Tachycardia, unspecified: Secondary | ICD-10-CM | POA: Diagnosis not present

## 2024-06-08 ENCOUNTER — Other Ambulatory Visit (HOSPITAL_COMMUNITY): Payer: Self-pay

## 2024-06-26 ENCOUNTER — Other Ambulatory Visit (HOSPITAL_COMMUNITY): Payer: Self-pay

## 2024-06-26 ENCOUNTER — Ambulatory Visit

## 2024-06-26 VITALS — BP 98/64 | HR 100 | Ht 67.0 in | Wt 156.2 lb

## 2024-06-26 DIAGNOSIS — R000 Tachycardia, unspecified: Secondary | ICD-10-CM

## 2024-06-26 DIAGNOSIS — E7800 Pure hypercholesterolemia, unspecified: Secondary | ICD-10-CM

## 2024-06-26 DIAGNOSIS — I493 Ventricular premature depolarization: Secondary | ICD-10-CM | POA: Diagnosis not present

## 2024-06-26 MED ORDER — METOPROLOL SUCCINATE ER 25 MG PO TB24
25.0000 mg | ORAL_TABLET | Freq: Every day | ORAL | 3 refills | Status: AC
  Filled 2024-06-26: qty 90, 90d supply, fill #0
  Filled 2024-09-03 – 2024-09-19 (×2): qty 90, 90d supply, fill #1

## 2024-06-26 NOTE — Patient Instructions (Signed)
 Medication Instructions:  Start Metoprolol  Succinate 25 mg daily Continue all other medications *If you need a refill on your cardiac medications before your next appointment, please call your pharmacy*  Lab Work: Tsh today  Testing/Procedures: Echo  first available   Follow-Up: At [facility], you and your health needs are our priority.  As part of our continuing mission to provide you with exceptional heart care, our providers are all part of one team.  This team includes your primary Cardiologist (physician) and Advanced Practice Providers or APPs (Physician Assistants and Nurse Practitioners) who all work together to provide you with the care you need, when you need it.  Your next appointment:  3 months    Provider:  Sidonie   We recommend signing up for the patient portal called MyChart.  Sign up information is provided on this After Visit Summary.  MyChart is used to connect with patients for Virtual Visits (Telemedicine).  Patients are able to view lab/test results, encounter notes, upcoming appointments, etc.  Non-urgent messages can be sent to your provider as well.   To learn more about what you can do with MyChart, go to [email or web address].

## 2024-06-26 NOTE — Addendum Note (Signed)
 Addended by: CHRISTIANNE CHANNING PARAS on: 06/26/2024 04:07 PM   Modules accepted: Orders

## 2024-06-26 NOTE — Progress Notes (Signed)
    Cardiology Office Note Date:  06/26/2024  ID:  Stephanie Benson, DOB 11/05/1960, MRN [id number] PCP:  Dwight Trula SQUIBB, MD  Cardiologist: Joelle VEAR Ren Donley, MD  Chief Complaint  Patient presents with   Tachycardia     Problems Hyperlipidemia PVCs CAC 0  Visits  09/09: TTE, metop XL 25, TSH    History of Present Illness: Stephanie Benson is a 63 y.o. female with Hx of PVCs and hyperlipidemia presenting with palpitations.  Today, while working her dog on a flat surface, she has experiencing chest palpitations. It lasted for about 45 mins with no associated dyspnea or pre-syncope. She has palpitations in the past but feels like the burden has increased over the last few months. Of note, she had a 14-day monitor done earlier this year with no evidence of high PVC burden or tachy-arrhythmia.   ROS: Please see the history of present illness. All other systems are reviewed and negative.   PHYSICAL EXAM: VS:  BP 98/64   Pulse 100   Ht 5' 7 (1.702 m)   Wt 156 lb 3.2 oz (70.9 kg)   LMP 10/19/2007   SpO2 98%   BMI 24.46 kg/m  , BMI Body mass index is 24.46 kg/m. GEN: Well nourished, well developed, in no acute distress HEENT: normal Neck: no JVD, carotid bruits, or masses Cardiac: tachycardic but irregular rhythm w/ extra beats; no murmurs, rubs, or gallops,no edema  Respiratory:  CTAB bilaterally, normal work of breathing GI: soft, nontender, nondistended, + BS Extremities: No LE edema Skin: warm and dry, no rash Neuro:  Strength and sensation are intact  EKG: PVCs  Recent Labs: Reviewed  Studies: Reviewed  ASSESSMENT AND PLAN: Stephanie Benson is a 63 y.o. female with Hx of PVCs and hyperlipidemia presenting with palpitations.  #Palpitations #PVCs #Hyperlipidemia - Presenting with palpitations and concern for atrial fibrillation on Apple Watch. Looking at the strip, no evidence of Afib. Looks like intermittent PVCs. - TSH 1.8 12/2023 - Obtain TTE - Start  metop XL 25 given symptomatic PVC - Follow up in 3 months to evaluate symptom burden and discuss echocardiogram   Signed, Joelle VEAR Ren Donley, MD  06/26/2024 3:28 PM    [facility]

## 2024-06-27 ENCOUNTER — Ambulatory Visit: Payer: Self-pay

## 2024-06-27 LAB — TSH: TSH: 1.84 u[IU]/mL (ref 0.450–4.500)

## 2024-07-09 ENCOUNTER — Other Ambulatory Visit (HOSPITAL_COMMUNITY): Payer: Self-pay

## 2024-07-10 ENCOUNTER — Other Ambulatory Visit: Payer: Self-pay

## 2024-07-10 ENCOUNTER — Other Ambulatory Visit (HOSPITAL_COMMUNITY): Payer: Self-pay

## 2024-07-10 ENCOUNTER — Other Ambulatory Visit (HOSPITAL_BASED_OUTPATIENT_CLINIC_OR_DEPARTMENT_OTHER): Payer: Self-pay

## 2024-07-10 ENCOUNTER — Encounter: Payer: Self-pay | Admitting: Pharmacist

## 2024-07-20 ENCOUNTER — Encounter (HOSPITAL_BASED_OUTPATIENT_CLINIC_OR_DEPARTMENT_OTHER): Payer: Self-pay | Admitting: Cardiology

## 2024-07-20 ENCOUNTER — Ambulatory Visit (HOSPITAL_BASED_OUTPATIENT_CLINIC_OR_DEPARTMENT_OTHER): Admitting: Cardiology

## 2024-07-20 VITALS — BP 104/68 | HR 60 | Ht 67.0 in | Wt 161.5 lb

## 2024-07-20 DIAGNOSIS — I479 Paroxysmal tachycardia, unspecified: Secondary | ICD-10-CM

## 2024-07-20 DIAGNOSIS — E7800 Pure hypercholesterolemia, unspecified: Secondary | ICD-10-CM | POA: Diagnosis not present

## 2024-07-20 DIAGNOSIS — I493 Ventricular premature depolarization: Secondary | ICD-10-CM

## 2024-07-20 DIAGNOSIS — Z712 Person consulting for explanation of examination or test findings: Secondary | ICD-10-CM | POA: Diagnosis not present

## 2024-07-20 NOTE — Patient Instructions (Addendum)
 Medication Instructions:  No changes *If you need a refill on your cardiac medications before your next appointment, please call your pharmacy*  Lab Work: none   Testing/Procedures: Echo as planned  Follow-Up: At [facility], you and your health needs are our priority.  As part of our continuing mission to provide you with exceptional heart care, our providers are all part of one team.  This team includes your primary Cardiologist (physician) and Advanced Practice Providers or APPs (Physician Assistants and Nurse Practitioners) who all work together to provide you with the care you need, when you need it.  Your next appointment:   3 month(s)  Provider:   Shelda Bruckner, MD, Rosaline Bane, NP, or Reche Finder, NP

## 2024-07-20 NOTE — Progress Notes (Signed)
  Cardiology Office Note:  .   Date:  07/20/2024  ID:  Jon KATHEE Hof, DOB Sep 05, 1961, MRN [id number] PCP: Dwight Trula SQUIBB, MD  [facility] Providers Cardiologist:  Shelda Bruckner, MD {  History of Present Illness: .   ZAKYRA KUKUK is a 63 y.o. female with PMH palpitations, hyperlipidemia. I initially met her 10/28/2021 for evaluation of hyperlipidemia  Pertinent CV history: Ca score 0 in 2023.  Today: I have not seen her since 2023, but she was seen on 06/26/24 for an urgent visit with Dr. Ren Ny for palpitations. Reviewed her visit and ECG. Was at checkout, felt like she might pass out, checked her HR on her apple watch and it was 185 bpm. I reviewed her captured apple ECGs, which read as afib but were sinus with PVCs.  She currently is feeling well. No events since she started taking metoprolol . We reviewed her monitor together.  No clear triggers, she has been monitoring for patterns.  ROS: Denies chest pain, shortness of breath at rest or with normal exertion. No PND, orthopnea, LE edema or unexpected weight gain. No syncope or palpitations. ROS otherwise negative except as noted.   Studies Reviewed: SABRA    EKG:       Physical Exam:   VS:  BP 104/68   Pulse 60   Ht 5' 7 (1.702 m)   Wt 161 lb 8 oz (73.3 kg)   LMP 10/19/2007   SpO2 98%   BMI 25.29 kg/m    Wt Readings from Last 3 Encounters:  07/20/24 161 lb 8 oz (73.3 kg)  06/26/24 156 lb 3.2 oz (70.9 kg)  04/26/24 154 lb (69.9 kg)    GEN: Well nourished, well developed in no acute distress HEENT: Normal, moist mucous membranes NECK: No JVD CARDIAC: regular rhythm, normal S1 and S2, no rubs or gallops. No murmur. VASCULAR: Radial and DP pulses 2+ bilaterally. No carotid bruits RESPIRATORY:  Clear to auscultation without rales, wheezing or rhonchi  ABDOMEN: Soft, non-tender, non-distended MUSCULOSKELETAL:  Ambulates independently SKIN: Warm and dry, no edema NEUROLOGIC:  Alert and oriented x 3.  No focal neuro deficits noted. PSYCHIATRIC:  Normal affect    ASSESSMENT AND PLAN: .    Paroxysmal tachycardia PVCs -monitor from 7.2025 not yet finalized but results reviewed with her today, showed her actual report/symptomatic evets. 2 brief episodes of SVT (5 and 7 beats), no NSVT, 17 symptomatic events. These were sinus rhythm/sinus tach and about half of them had PAC and/or PVCs. PAC and PVC burden <1% -echo ordered -she was started on metoprolol  several weeks ago  Hypercholesterolemia -with calcium  score of 0 in 2023, but aortic atherosclerosis  Dispo: 3 mos or sooner as needed  Signed, Shelda Bruckner, MD   Shelda Bruckner, MD, PhD, [facility]  [facility]  [place]  [facility] & Vascular at [facility] at [facility] [address], [address] [place], [place] [address] [phone]

## 2024-07-23 ENCOUNTER — Encounter (HOSPITAL_BASED_OUTPATIENT_CLINIC_OR_DEPARTMENT_OTHER): Payer: Self-pay | Admitting: Cardiology

## 2024-07-25 DIAGNOSIS — R000 Tachycardia, unspecified: Secondary | ICD-10-CM

## 2024-08-01 ENCOUNTER — Ambulatory Visit (HOSPITAL_COMMUNITY)
Admission: RE | Admit: 2024-08-01 | Discharge: 2024-08-01 | Disposition: A | Discharge status: Home / Self Care | Source: Ambulatory Visit | Attending: Cardiovascular Disease | Admitting: Cardiovascular Disease

## 2024-08-01 DIAGNOSIS — R000 Tachycardia, unspecified: Secondary | ICD-10-CM

## 2024-08-01 DIAGNOSIS — I361 Nonrheumatic tricuspid (valve) insufficiency: Secondary | ICD-10-CM | POA: Diagnosis not present

## 2024-08-01 LAB — ECHOCARDIOGRAM COMPLETE
Area-P 1/2: 3.05 cm2
S' Lateral: 2.9 cm

## 2024-08-08 ENCOUNTER — Other Ambulatory Visit (HOSPITAL_COMMUNITY): Payer: Self-pay

## 2024-08-08 DIAGNOSIS — L814 Other melanin hyperpigmentation: Secondary | ICD-10-CM | POA: Diagnosis not present

## 2024-08-08 DIAGNOSIS — Z85820 Personal history of malignant melanoma of skin: Secondary | ICD-10-CM | POA: Diagnosis not present

## 2024-08-08 DIAGNOSIS — D1801 Hemangioma of skin and subcutaneous tissue: Secondary | ICD-10-CM | POA: Diagnosis not present

## 2024-08-08 DIAGNOSIS — L819 Disorder of pigmentation, unspecified: Secondary | ICD-10-CM | POA: Diagnosis not present

## 2024-08-08 DIAGNOSIS — L720 Epidermal cyst: Secondary | ICD-10-CM | POA: Diagnosis not present

## 2024-08-08 DIAGNOSIS — L80 Vitiligo: Secondary | ICD-10-CM | POA: Diagnosis not present

## 2024-08-08 DIAGNOSIS — L918 Other hypertrophic disorders of the skin: Secondary | ICD-10-CM | POA: Diagnosis not present

## 2024-08-08 DIAGNOSIS — D2239 Melanocytic nevi of other parts of face: Secondary | ICD-10-CM | POA: Diagnosis not present

## 2024-08-08 DIAGNOSIS — Z85828 Personal history of other malignant neoplasm of skin: Secondary | ICD-10-CM | POA: Diagnosis not present

## 2024-08-08 DIAGNOSIS — L738 Other specified follicular disorders: Secondary | ICD-10-CM | POA: Diagnosis not present

## 2024-08-08 MED ORDER — TRIAMCINOLONE ACETONIDE 0.1 % EX CREA
TOPICAL_CREAM | CUTANEOUS | 3 refills | Status: AC
  Filled 2024-08-08: qty 30, 30d supply, fill #0

## 2024-08-09 ENCOUNTER — Other Ambulatory Visit: Payer: Self-pay

## 2024-08-13 ENCOUNTER — Ambulatory Visit (HOSPITAL_BASED_OUTPATIENT_CLINIC_OR_DEPARTMENT_OTHER): Payer: Self-pay | Admitting: Family

## 2024-09-03 ENCOUNTER — Other Ambulatory Visit (HOSPITAL_COMMUNITY): Payer: Self-pay

## 2024-09-04 ENCOUNTER — Other Ambulatory Visit (HOSPITAL_COMMUNITY): Payer: Self-pay

## 2024-09-04 ENCOUNTER — Other Ambulatory Visit: Payer: Self-pay

## 2024-09-04 MED ORDER — ROSUVASTATIN CALCIUM 5 MG PO TABS
5.0000 mg | ORAL_TABLET | Freq: Every day | ORAL | 3 refills | Status: AC
  Filled 2024-09-04: qty 90, 90d supply, fill #0

## 2024-09-06 DIAGNOSIS — H52203 Unspecified astigmatism, bilateral: Secondary | ICD-10-CM | POA: Diagnosis not present

## 2024-09-06 DIAGNOSIS — H524 Presbyopia: Secondary | ICD-10-CM | POA: Diagnosis not present

## 2024-09-19 ENCOUNTER — Other Ambulatory Visit: Payer: Self-pay

## 2024-09-25 ENCOUNTER — Ambulatory Visit

## 2024-09-26 ENCOUNTER — Encounter: Payer: Self-pay | Admitting: Cardiology

## 2024-10-25 ENCOUNTER — Encounter (HOSPITAL_BASED_OUTPATIENT_CLINIC_OR_DEPARTMENT_OTHER): Payer: Self-pay

## 2024-10-31 ENCOUNTER — Other Ambulatory Visit: Payer: Self-pay

## 2024-10-31 ENCOUNTER — Other Ambulatory Visit (HOSPITAL_COMMUNITY): Payer: Self-pay

## 2024-10-31 MED ORDER — ESCITALOPRAM OXALATE 10 MG PO TABS
10.0000 mg | ORAL_TABLET | Freq: Every day | ORAL | 1 refills | Status: AC
  Filled 2024-10-31: qty 90, 90d supply, fill #0

## 2024-12-13 ENCOUNTER — Ambulatory Visit (HOSPITAL_BASED_OUTPATIENT_CLINIC_OR_DEPARTMENT_OTHER): Payer: 59 | Admitting: Obstetrics & Gynecology

## 2024-12-28 ENCOUNTER — Ambulatory Visit (HOSPITAL_BASED_OUTPATIENT_CLINIC_OR_DEPARTMENT_OTHER): Admitting: Cardiology

## 2025-01-01 ENCOUNTER — Ambulatory Visit (HOSPITAL_BASED_OUTPATIENT_CLINIC_OR_DEPARTMENT_OTHER): Admitting: Obstetrics & Gynecology
